# Patient Record
Sex: Female | Born: 2000 | Race: Black or African American | Hispanic: No | Marital: Single | Smoking: Never smoker
Health system: Southern US, Community
[De-identification: ages and names within clinical notes are randomized; demographics above are authoritative.]

## PROBLEM LIST (undated history)

## (undated) ENCOUNTER — Ambulatory Visit (HOSPITAL_COMMUNITY): Admission: EM | Payer: Medicaid Other

## (undated) DIAGNOSIS — R87629 Unspecified abnormal cytological findings in specimens from vagina: Secondary | ICD-10-CM

## (undated) DIAGNOSIS — T8859XA Other complications of anesthesia, initial encounter: Secondary | ICD-10-CM

## (undated) DIAGNOSIS — O24419 Gestational diabetes mellitus in pregnancy, unspecified control: Secondary | ICD-10-CM

## (undated) DIAGNOSIS — Z789 Other specified health status: Secondary | ICD-10-CM

## (undated) HISTORY — PX: MOUTH SURGERY: SHX715

## (undated) HISTORY — DX: Unspecified abnormal cytological findings in specimens from vagina: R87.629

## (undated) HISTORY — DX: Gestational diabetes mellitus in pregnancy, unspecified control: O24.419

## (undated) HISTORY — PX: NO PAST SURGERIES: SHX2092

## (undated) HISTORY — DX: Other specified health status: Z78.9

---
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
## 2018-05-09 ENCOUNTER — Other Ambulatory Visit: Payer: Self-pay

## 2018-05-09 ENCOUNTER — Ambulatory Visit
Admission: EM | Admit: 2018-05-09 | Discharge: 2018-05-09 | Disposition: A | Discharge status: Home / Self Care | Payer: Medicaid Other | Attending: Family Medicine | Admitting: Family Medicine

## 2018-05-09 DIAGNOSIS — K219 Gastro-esophageal reflux disease without esophagitis: Secondary | ICD-10-CM | POA: Diagnosis not present

## 2018-05-09 MED ORDER — LIDOCAINE VISCOUS HCL 2 % MT SOLN
15.0000 mL | Freq: Once | OROMUCOSAL | Status: AC
  Administered 2018-05-09: 17:00:00 15 mL via ORAL

## 2018-05-09 MED ORDER — OMEPRAZOLE 20 MG PO CPDR: 20.0000 mg | DELAYED_RELEASE_CAPSULE | Freq: Every day | ORAL | 0 refills | Status: DC

## 2018-05-09 MED ORDER — POLYETHYLENE GLYCOL 3350 17 GM/SCOOP PO POWD: 1.0000 | Freq: Once | ORAL | 0 refills | Status: AC

## 2018-05-09 MED ORDER — ALUM & MAG HYDROXIDE-SIMETH 200-200-20 MG/5ML PO SUSP
30.0000 mL | Freq: Once | ORAL | Status: AC
  Administered 2018-05-09: 30 mL via ORAL

## 2018-05-09 NOTE — Discharge Instructions (Signed)
I believe your symptoms are associated with acid reflux.  We are going to try omeprazole 20 mg once daily  Make sure that you take the omeprazole 30- 60 minutes prior to a meal with a glass of water.  Avoid spicy, greasy foods, caffeine, chocolate and milk products.  No eating 2-3 hours before bedtime. Elevate the head of the bed 30 degrees.  Try this for a few weeks to see if this improves your symptoms.  If you don't see any improvement or your symptoms worsen please follow up with a GI   

## 2018-05-09 NOTE — ED Provider Notes (Signed)
[facility]    CSN: [id number] Arrival date & time: 05/09/18  1610     History   Chief Complaint Chief Complaint  Patient presents with  . Chest Pain    HPI Michelle Chen is a 18 y.o. female.   Pt is a 18 year old female that presents with chest pain that she describes as burning in her chest. Symptoms have been waxing and waning for about a week. Worse with eating. She has been eating fast food most of the past week.  Her grandmother gave her gas x on sunday that somewhat relieved the pain. She does have a hx of acid reflux and has used the [employer] in the past that helped. She is also having some mild constipation and has been straining to have BM. No N,V,D. No cough, congestion or SOB. No fever. No recent travels or sick contacts.   ROS per HPI      History reviewed. No pertinent past medical history.  Patient Active Problem List   Diagnosis Date Noted  . Encounter for management and injection of depo-Provera 11/19/2014    History reviewed. No pertinent surgical history.  OB History   No obstetric history on file.      Home Medications    Prior to Admission medications   Medication Sig Start Date End Date Taking? Authorizing Provider  omeprazole (PRILOSEC) 20 MG capsule Take 1 capsule (20 mg total) by mouth daily. 05/09/18   Dahlia Byes A, NP  polyethylene glycol powder (GLYCOLAX/MIRALAX) 17 GM/SCOOP powder Take 255 g by mouth once for 1 dose. 1 scoop by mouth daily until good bowel movement. 05/09/18 05/09/18  Janace Aris, NP    Family History Family History  Problem Relation Age of Onset  . Learning disabilities Sister   . Asthma Sister   . Asthma Brother   . Asthma Maternal Grandmother   . Alzheimer's disease Maternal Grandfather     Social History Social History   Tobacco Use  . Smoking status: Passive Smoke Exposure - Never Smoker  . Smokeless tobacco: Never Used  . Tobacco comment: gma smokes in and out of the home   Substance  Use Topics  . Alcohol use: No    Alcohol/week: 0.0 standard drinks  . Drug use: No     Allergies   Patient has no known allergies.   Review of Systems Review of Systems   Physical Exam Triage Vital Signs ED Triage Vitals  Enc Vitals Group     BP 05/09/18 1621 116/79     Pulse Rate 05/09/18 1621 (!) 54     Resp 05/09/18 1621 20     Temp 05/09/18 1621 98.2 F (36.8 C)     Temp Source 05/09/18 1621 Oral     SpO2 05/09/18 1621 98 %     Weight --      Height --      Head Circumference --      Peak Flow --      Pain Score 05/09/18 1622 4     Pain Loc --      Pain Edu? --      Excl. in GC? --    No data found.  Updated Vital Signs BP 116/79   Pulse (!) 54   Temp 98.2 F (36.8 C) (Oral)   Resp 20   LMP  (LMP Unknown)   SpO2 98%   Visual Acuity Right Eye Distance:   Left Eye Distance:   Bilateral Distance:  Right Eye Near:   Left Eye Near:    Bilateral Near:     Physical Exam Vitals signs and nursing note reviewed.  Constitutional:      Appearance: She is well-developed.  HENT:     Head: Normocephalic and atraumatic.  Neck:     Musculoskeletal: Normal range of motion.  Cardiovascular:     Rate and Rhythm: Normal rate and regular rhythm.     Heart sounds: Normal heart sounds.  Pulmonary:     Effort: Pulmonary effort is normal.     Breath sounds: Normal breath sounds.  Chest:     Chest wall: No tenderness.  Musculoskeletal: Normal range of motion.  Skin:    General: Skin is warm and dry.  Neurological:     Mental Status: She is alert.  Psychiatric:        Mood and Affect: Mood normal.      UC Treatments / Results  Labs (all labs ordered are listed, but only abnormal results are displayed) Labs Reviewed - No data to display  EKG None  Radiology No results found.  Procedures Procedures (including critical care time)  Medications Ordered in UC Medications  alum & mag hydroxide-simeth (MAALOX/MYLANTA) 200-200-20 MG/5ML suspension 30  mL (30 mLs Oral Given 05/09/18 1651)    And  lidocaine (XYLOCAINE) 2 % viscous mouth solution 15 mL (15 mLs Oral Given 05/09/18 1651)    Initial Impression / Assessment and Plan / UC Course  I have reviewed the triage vital signs and the nursing notes.  Pertinent labs & imaging results that were available during my care of the patient were reviewed by me and considered in my medical decision making (see chart for details).     Exam normal VSS, non toxic or ill appearing.  No concern for ACS Symptoms consistent with GERD GI cocktail given here in the clinic Some relief from the GI cocktail Will have her start on omeprazole daily with instruction Instructed to avoid spicy, greasy foods MiraLAX prescribed for constipation Follow up as needed for continued or worsening symptoms  Final Clinical Impressions(s) / UC Diagnoses   Final diagnoses:  Gastroesophageal reflux disease without esophagitis     Discharge Instructions     I believe your symptoms are associated with acid reflux.  We are going to try omeprazole 20 mg once daily  Make sure that you take the omeprazole 30- 60 minutes prior to a meal with a glass of water.  Avoid spicy, greasy foods, caffeine, chocolate and milk products.  No eating 2-3 hours before bedtime. Elevate the head of the bed 30 degrees.  Try this for a few weeks to see if this improves your symptoms.  If you don't see any improvement or your symptoms worsen please follow up with a GI      ED Prescriptions    Medication Sig Dispense Auth. Provider   omeprazole (PRILOSEC) 20 MG capsule Take 1 capsule (20 mg total) by mouth daily. 30 capsule Kerman Pfost A, NP   polyethylene glycol powder (GLYCOLAX/MIRALAX) 17 GM/SCOOP powder Take 255 g by mouth once for 1 dose. 1 scoop by mouth daily until good bowel movement. 255 g Dahlia ByesBast, Tynan Boesel A, NP     Controlled Substance Prescriptions [place] Controlled Substance Registry consulted? Not Applicable   Janace ArisBast, Delores Thelen A, NP  05/09/18 [phone]

## 2018-11-02 ENCOUNTER — Ambulatory Visit (INDEPENDENT_AMBULATORY_CARE_PROVIDER_SITE_OTHER): Payer: Medicaid Other | Admitting: Pediatrics

## 2018-11-02 ENCOUNTER — Other Ambulatory Visit: Payer: Self-pay

## 2018-11-02 DIAGNOSIS — N760 Acute vaginitis: Secondary | ICD-10-CM | POA: Diagnosis not present

## 2018-11-02 MED ORDER — FLUCONAZOLE 150 MG PO TABS: ORAL_TABLET | ORAL | 0 refills | Status: DC

## 2018-11-02 NOTE — Progress Notes (Signed)
Virtual Visit via Video Note  I connected with Michelle Chen on 11/02/18 at  3:30 PM EDT by a video enabled telemedicine application and verified that I am speaking with the correct person using two identifiers.   Location of patient/parent: stopped in her car   I discussed the limitations of evaluation and management by telemedicine and the availability of in person appointments.  I discussed that the purpose of this telehealth visit is to provide medical care while limiting exposure to the novel coronavirus.  The patient expressed understanding and agreed to proceed.  Reason for visit:   Itching and burning of vaginal area off and on for the past 1 week.   History of Present Illness:   This 18 year old is concerned about vaginal itching with occasional burning off and on for the past week. She has redness and tenderness of the labia and a white discharge. She denies any other vaginal discharge. No abdominal pain. No pelvic pain. No back pain. Last period 4 weeks ago. She has a nexplanon in place since 07/2017. She denies sexual activity and reports she has never been sexually active.    Observations/Objective:   Alert and in no distress. Vaginal exam deferred since virtual visit. No abdominal, suprapubic or CVA tenderness.   Assessment and Plan:   1. Acute vaginitis Most consistent with yeast vaginitis Will treat empirically and patient to return call for increased severity of symptoms or not resolved after treatment  - fluconazole (DIFLUCAN) 150 MG tablet; Take 1 tablet on day 1 and repeat in 72 hours  Dispense: 2 tablet; Refill: 0   Follow Up Instructions:   Patient to f/u birth control surveillance with adolescent clinic and transfer care to adult medicine for routine care. May follow up here as needed until transfer to adult care.    I discussed the assessment and treatment plan with the patient and/or parent/guardian. They were provided an opportunity to ask questions and all  were answered. They agreed with the plan and demonstrated an understanding of the instructions.   They were advised to call back or seek an in-person evaluation in the emergency room if the symptoms worsen or if the condition fails to improve as anticipated.  I spent 16 minutes on this telehealth visit inclusive of face-to-face video and care coordination time I was located at [facility] during this encounter.  Rae Lips, MD

## 2019-05-25 ENCOUNTER — Ambulatory Visit (INDEPENDENT_AMBULATORY_CARE_PROVIDER_SITE_OTHER): Payer: Medicaid Other | Admitting: Family

## 2019-05-25 ENCOUNTER — Encounter: Payer: Self-pay | Admitting: Family

## 2019-05-25 ENCOUNTER — Other Ambulatory Visit: Payer: Self-pay

## 2019-05-25 ENCOUNTER — Other Ambulatory Visit (HOSPITAL_COMMUNITY)
Admission: RE | Admit: 2019-05-25 | Discharge: 2019-05-25 | Disposition: A | Discharge status: Home / Self Care | Payer: Medicaid Other | Source: Ambulatory Visit | Attending: Family | Admitting: Family

## 2019-05-25 VITALS — BP 117/73 | HR 75 | Ht 63.0 in | Wt 167.6 lb

## 2019-05-25 DIAGNOSIS — Z975 Presence of (intrauterine) contraceptive device: Secondary | ICD-10-CM | POA: Diagnosis not present

## 2019-05-25 DIAGNOSIS — Z113 Encounter for screening for infections with a predominantly sexual mode of transmission: Secondary | ICD-10-CM | POA: Insufficient documentation

## 2019-05-25 DIAGNOSIS — N921 Excessive and frequent menstruation with irregular cycle: Secondary | ICD-10-CM | POA: Diagnosis not present

## 2019-05-25 NOTE — Progress Notes (Signed)
History was provided by the patient.  Michelle Chen is a 20 y.o. female who is here for breakthrough bleeding with nexplanon.   PCP confirmed? Yes.    Michelle Erie, MD  HPI:   -sudden onset of breakthrough bleeding with Nexplanon  -no cramping or pain with intercourse -no nipple changes or discharge -no vaginal lesions or discharge -   Review of Systems  Constitutional: Negative for chills, fever and malaise/fatigue.  Eyes: Negative for blurred vision, double vision and pain.  Cardiovascular: Negative for chest pain and palpitations.  Gastrointestinal: Negative for abdominal pain, diarrhea and nausea.  Genitourinary: Negative for dysuria.  Skin: Negative for rash.  Neurological: Negative for dizziness and headaches.  Psychiatric/Behavioral: Negative for depression. The patient is not nervous/anxious.      Patient Active Problem List   Diagnosis Date Noted  . Encounter for management and injection of depo-Provera 11/19/2014    Current Outpatient Medications on File Prior to Visit  Medication Sig Dispense Refill  . fluconazole (DIFLUCAN) 150 MG tablet Take 1 tablet on day 1 and repeat in 72 hours 2 tablet 0  . omeprazole (PRILOSEC) 20 MG capsule Take 1 capsule (20 mg total) by mouth daily. (Patient not taking: Reported on 05/25/2019) 30 capsule 0   Current Facility-Administered Medications on File Prior to Visit  Medication Dose Route Frequency Provider Last Rate Last Admin  . etonogestrel (NEXPLANON) implant 68 mg  68 mg Subdermal Once Georges Mouse, NP        No Known Allergies  Physical Exam:    Vitals:   05/25/19 0832  BP: 117/73  Pulse: 75  Weight: 167 lb 9.6 oz (76 kg)  Height: 5\' 3"  (1.6 m)   Wt Readings from Last 3 Encounters:  05/25/19 167 lb 9.6 oz (76 kg)  08/20/17 173 lb 12.8 oz (78.8 kg) (94 %, Z= 1.53)*  07/18/17 150 lb (68 kg) (83 %, Z= 0.95)*   * Growth percentiles are based on CDC (Girls, 2-20 Years) data.    Growth percentile  SmartLinks can only be used for patients less than 5 years old. No LMP recorded. Patient has had an injection.  Physical Exam Constitutional:      Appearance: She is not ill-appearing.  HENT:     Head: Normocephalic.  Eyes:     Extraocular Movements: Extraocular movements intact.     Pupils: Pupils are equal, round, and reactive to light.  Cardiovascular:     Rate and Rhythm: Normal rate and regular rhythm.     Heart sounds: No murmur.  Pulmonary:     Effort: Pulmonary effort is normal.  Musculoskeletal:        General: No swelling. Normal range of motion.     Cervical back: Normal range of motion and neck supple. No tenderness.  Lymphadenopathy:     Cervical: No cervical adenopathy.  Skin:    General: Skin is warm and dry.     Findings: No rash.  Neurological:     General: No focal deficit present.     Mental Status: She is alert and oriented to person, place, and time.  Psychiatric:        Mood and Affect: Mood normal.      Assessment/Plan:  19 yo A/I female with nexplanon presenting with c/o of sudden onset of bleeding after a period of amenorrhea with method. Discussed that we will assess for infection or other process for reason for bleeding and determine next steps. She would like to keep  the method if bleeding can subside.   1. Breakthrough bleeding on Nexplanon  - TSH - T4, free - Prolactin - VITAMIN D 25 Hydroxy (Vit-D Deficiency, Fractures) - Hemoglobin A1c - WET PREP BY MOLECULAR PROBE - CBC  2. Routine screening for STI (sexually transmitted infection)  - Urine cytology ancillary only

## 2019-05-25 NOTE — Patient Instructions (Addendum)
Today we discussed reasons why you may be having breakthrough bleeding.  I will call you with results from today's tests and discuss options.

## 2019-05-26 LAB — CBC
HCT: 45.6 % — ABNORMAL HIGH (ref 35.0–45.0)
Hemoglobin: 15 g/dL (ref 11.7–15.5)
MCH: 32.1 pg (ref 27.0–33.0)
MCHC: 32.9 g/dL (ref 32.0–36.0)
MCV: 97.4 fL (ref 80.0–100.0)
MPV: 10.5 fL (ref 7.5–12.5)
Platelets: 314 10*3/uL (ref 140–400)
RBC: 4.68 10*6/uL (ref 3.80–5.10)
RDW: 11.9 % (ref 11.0–15.0)
WBC: 7 10*3/uL (ref 3.8–10.8)

## 2019-05-26 LAB — WET PREP BY MOLECULAR PROBE
Candida species: NOT DETECTED
MICRO NUMBER:: 10420896
SPECIMEN QUALITY:: ADEQUATE
Trichomonas vaginosis: NOT DETECTED

## 2019-05-26 LAB — TSH: TSH: 1.33 mIU/L

## 2019-05-26 LAB — VITAMIN D 25 HYDROXY (VIT D DEFICIENCY, FRACTURES): Vit D, 25-Hydroxy: 9 ng/mL — ABNORMAL LOW (ref 30–100)

## 2019-05-26 LAB — T4, FREE: Free T4: 1.1 ng/dL (ref 0.8–1.4)

## 2019-05-26 LAB — HEMOGLOBIN A1C
Hgb A1c MFr Bld: 5.1 % of total Hgb (ref <5.7)
Mean Plasma Glucose: 100 (calc)
eAG (mmol/L): 5.5 (calc)

## 2019-05-26 LAB — PROLACTIN: Prolactin: 17 ng/mL

## 2019-05-29 LAB — URINE CYTOLOGY ANCILLARY ONLY
Bacterial Vaginitis-Urine: POSITIVE — AB
Candida Urine: NEGATIVE
Chlamydia: NEGATIVE
Comment: NEGATIVE
Comment: NEGATIVE
Comment: NORMAL
Neisseria Gonorrhea: NEGATIVE
Trichomonas: NEGATIVE

## 2019-05-30 ENCOUNTER — Other Ambulatory Visit: Payer: Self-pay | Admitting: Family

## 2019-05-30 MED ORDER — METRONIDAZOLE 500 MG PO TABS
500.0000 mg | ORAL_TABLET | Freq: Two times a day (BID) | ORAL | 0 refills | Status: DC
Start: 2019-05-30 — End: 2019-09-27

## 2019-05-30 MED ORDER — VITAMIN D (ERGOCALCIFEROL) 1.25 MG (50000 UNIT) PO CAPS
50000.0000 [IU] | ORAL_CAPSULE | ORAL | 0 refills | Status: DC
Start: 2019-05-30 — End: 2019-07-24

## 2019-05-30 NOTE — Progress Notes (Unsigned)
fla 

## 2019-06-07 ENCOUNTER — Encounter (HOSPITAL_COMMUNITY): Payer: Self-pay | Admitting: Emergency Medicine

## 2019-06-07 ENCOUNTER — Other Ambulatory Visit: Payer: Self-pay

## 2019-06-07 ENCOUNTER — Ambulatory Visit (HOSPITAL_COMMUNITY)
Admission: EM | Admit: 2019-06-07 | Discharge: 2019-06-07 | Disposition: A | Discharge status: Home / Self Care | Payer: Medicaid Other | Attending: Internal Medicine | Admitting: Internal Medicine

## 2019-06-07 DIAGNOSIS — J020 Streptococcal pharyngitis: Secondary | ICD-10-CM

## 2019-06-07 LAB — POCT RAPID STREP A: Streptococcus, Group A Screen (Direct): POSITIVE — AB

## 2019-06-07 MED ORDER — ACETAMINOPHEN 325 MG PO TABS
ORAL_TABLET | ORAL | Status: AC
  Filled 2019-06-07: qty 2

## 2019-06-07 MED ORDER — ACETAMINOPHEN 325 MG PO TABS
650.0000 mg | ORAL_TABLET | Freq: Once | ORAL | Status: AC
  Administered 2019-06-07: 650 mg via ORAL

## 2019-06-07 MED ORDER — AMOXICILLIN 500 MG PO CAPS
1000.0000 mg | ORAL_CAPSULE | Freq: Every day | ORAL | 0 refills | Status: AC
Start: 2019-06-07 — End: 2019-06-17

## 2019-06-07 NOTE — ED Triage Notes (Signed)
Sore throat for 2 days. Denies headache, fever, body aches, cough, congestion.

## 2019-06-07 NOTE — ED Provider Notes (Signed)
[place]    CSN: [id number] Arrival date & time: 06/07/19  1696      History   Chief Complaint Chief Complaint  Patient presents with  . Sore Throat    HPI Michelle Chen is a 19 y.o. female.   Patient is a 19 year old female presents today with sore throat x2 days.  Symptoms have been constant.  She has had pain with swallowing.  Fever here today of 100.8. has not taken anything for the symptoms.  Denies any associated headache, cough, congestion, rhinorrhea, loss of taste or smell.  ROS per HPI      History reviewed. No pertinent past medical history.  Patient Active Problem List   Diagnosis Date Noted  . Encounter for management and injection of depo-Provera 11/19/2014    History reviewed. No pertinent surgical history.  OB History   No obstetric history on file.      Home Medications    Prior to Admission medications   Medication Sig Start Date End Date Taking? Authorizing Provider  Vitamin D, Ergocalciferol, (DRISDOL) 1.25 MG (50000 UNIT) CAPS capsule Take 1 capsule (50,000 Units total) by mouth every 7 (seven) days. 05/30/19  Yes Parthenia Ames, NP  amoxicillin (AMOXIL) 500 MG capsule Take 2 capsules (1,000 mg total) by mouth daily for 10 days. 06/07/19 06/17/19  Loura Halt A, NP  fluconazole (DIFLUCAN) 150 MG tablet Take 1 tablet on day 1 and repeat in 72 hours 11/02/18   Rae Lips, MD  metroNIDAZOLE (FLAGYL) 500 MG tablet Take 1 tablet (500 mg total) by mouth 2 (two) times daily. 05/30/19   Parthenia Ames, NP  omeprazole (PRILOSEC) 20 MG capsule Take 1 capsule (20 mg total) by mouth daily. 05/09/18   Orvan July, NP    Family History Family History  Problem Relation Age of Onset  . Learning disabilities Sister   . Asthma Sister   . Asthma Brother   . Asthma Maternal Grandmother   . Alzheimer's disease Maternal Grandfather     Social History Social History   Tobacco Use  . Smoking status: Passive Smoke Exposure - Never  Smoker  . Smokeless tobacco: Never Used  . Tobacco comment: gma smokes in and out of the home   Substance Use Topics  . Alcohol use: No    Alcohol/week: 0.0 standard drinks  . Drug use: No     Allergies   Patient has no known allergies.   Review of Systems Review of Systems   Physical Exam Triage Vital Signs ED Triage Vitals  Enc Vitals Group     BP 06/07/19 0925 119/80     Pulse Rate 06/07/19 0923 (!) 108     Resp 06/07/19 0923 16     Temp 06/07/19 0923 (!) 100.8 F (38.2 C)     Temp Source 06/07/19 0923 Oral     SpO2 06/07/19 0923 100 %     Weight --      Height --      Head Circumference --      Peak Flow --      Pain Score 06/07/19 0922 7     Pain Loc --      Pain Edu? --      Excl. in [place]? --    No data found.  Updated Vital Signs BP 119/80   Pulse (!) 108   Temp (!) 100.8 F (38.2 C) (Oral)   Resp 16   LMP 05/17/2019   SpO2 100%  Visual Acuity Right Eye Distance:   Left Eye Distance:   Bilateral Distance:    Right Eye Near:   Left Eye Near:    Bilateral Near:     Physical Exam Vitals and nursing note reviewed.  Constitutional:      General: She is not in acute distress.    Appearance: Normal appearance. She is not ill-appearing, toxic-appearing or diaphoretic.  HENT:     Head: Normocephalic.     Nose: Nose normal.     Mouth/Throat:     Pharynx: Oropharynx is clear. Posterior oropharyngeal erythema present.     Tonsils: Tonsillar exudate present. 2+ on the right. 2+ on the left.  Eyes:     Conjunctiva/sclera: Conjunctivae normal.  Pulmonary:     Effort: Pulmonary effort is normal.  Musculoskeletal:        General: Normal range of motion.     Cervical back: Normal range of motion.  Skin:    General: Skin is warm and dry.     Findings: No rash.  Neurological:     Mental Status: She is alert.  Psychiatric:        Mood and Affect: Mood normal.      UC Treatments / Results  Labs (all labs ordered are listed, but only abnormal  results are displayed) Labs Reviewed  POCT RAPID STREP A - Abnormal; Notable for the following components:      Result Value   Streptococcus, Group A Screen (Direct) POSITIVE (*)    All other components within normal limits    EKG   Radiology No results found.  Procedures Procedures (including critical care time)  Medications Ordered in UC Medications  acetaminophen (TYLENOL) tablet 650 mg (650 mg Oral Given 06/07/19 0630)    Initial Impression / Assessment and Plan / UC Course  I have reviewed the triage vital signs and the nursing notes.  Pertinent labs & imaging results that were available during my care of the patient were reviewed by me and considered in my medical decision making (see chart for details).     Strep pharyngitis Rapid strep test positive.  Treat with amoxicillin.  Recommended Tylenol or ibuprofen as needed for pain, fever.  Warm salt water gargles. Follow up as needed for continued or worsening symptoms  Final Clinical Impressions(s) / UC Diagnoses   Final diagnoses:  Strep pharyngitis     Discharge Instructions     Treating you for strep throat. Take the amoxicillin daily for the next 10 days Tylenol or ibuprofen for pain, fever Warm salt water gargles and throat can help Follow up as needed for continued or worsening symptoms     ED Prescriptions    Medication Sig Dispense Auth. Provider   amoxicillin (AMOXIL) 500 MG capsule Take 2 capsules (1,000 mg total) by mouth daily for 10 days. 20 capsule Dahlia Byes A, NP     PDMP not reviewed this encounter.   Janace Aris, NP 06/07/19 [phone]

## 2019-06-07 NOTE — Discharge Instructions (Signed)
Treating you for strep throat. Take the amoxicillin daily for the next 10 days Tylenol or ibuprofen for pain, fever Warm salt water gargles and throat can help Follow up as needed for continued or worsening symptoms

## 2019-07-22 ENCOUNTER — Other Ambulatory Visit: Payer: Self-pay | Admitting: Family

## 2019-07-27 ENCOUNTER — Ambulatory Visit (HOSPITAL_COMMUNITY)
Admission: EM | Admit: 2019-07-27 | Discharge: 2019-07-27 | Disposition: A | Discharge status: Home / Self Care | Payer: Medicaid Other | Attending: Family Medicine | Admitting: Family Medicine

## 2019-07-27 ENCOUNTER — Other Ambulatory Visit: Payer: Self-pay

## 2019-07-27 ENCOUNTER — Encounter (HOSPITAL_COMMUNITY): Payer: Self-pay

## 2019-07-27 DIAGNOSIS — N898 Other specified noninflammatory disorders of vagina: Secondary | ICD-10-CM

## 2019-07-27 DIAGNOSIS — Z419 Encounter for procedure for purposes other than remedying health state, unspecified: Secondary | ICD-10-CM | POA: Diagnosis not present

## 2019-07-27 MED ORDER — FLUCONAZOLE 200 MG PO TABS: 200.0000 mg | ORAL_TABLET | Freq: Once | ORAL | 0 refills | Status: AC

## 2019-07-27 NOTE — ED Provider Notes (Signed)
[facility]   [id number] 07/27/19 Arrival Time: 1452   CC: VAGINAL DISCHARGE  SUBJECTIVE:  Michelle Chen is a 19 y.o. female who presents with complaints of vaginal discharge and itching for the last week.  Reports the discharge is white in color.  Denies using antibiotics.Has not taken OTC medications for this.  Reports that she would like to start birth control.  She denies fever, chills, nausea, vomiting, abdominal or pelvic pain, urinary symptoms, vaginal itching, vaginal odor, vaginal bleeding, dyspareunia, vaginal rashes or lesions.   Patient's last menstrual period was 07/05/2019. Current birth control method: none  ROS: As per HPI.  All other pertinent ROS negative.     History reviewed. No pertinent past medical history. History reviewed. No pertinent surgical history. No Known Allergies Current Facility-Administered Medications on File Prior to Encounter  Medication Dose Route Frequency Provider Last Rate Last Admin   etonogestrel (NEXPLANON) implant 68 mg  68 mg Subdermal Once Georges Mouse, NP       Current Outpatient Medications on File Prior to Encounter  Medication Sig Dispense Refill   metroNIDAZOLE (FLAGYL) 500 MG tablet Take 1 tablet (500 mg total) by mouth 2 (two) times daily. 14 tablet 0   omeprazole (PRILOSEC) 20 MG capsule Take 1 capsule (20 mg total) by mouth daily. 30 capsule 0   Vitamin D, Ergocalciferol, (DRISDOL) 1.25 MG (50000 UNIT) CAPS capsule TAKE 1 CAPSULE BY MOUTH EVERY 7 DAYS 8 capsule 0    Social History   Socioeconomic History   Marital status: Single    Spouse name: Not on file   Number of children: Not on file   Years of education: Not on file   Highest education level: Not on file  Occupational History   Not on file  Tobacco Use   Smoking status: Passive Smoke Exposure - Never Smoker   Smokeless tobacco: Never Used   Tobacco comment: gma smokes in and out of the home   Substance and Sexual Activity    Alcohol use: No    Alcohol/week: 0.0 standard drinks   Drug use: No   Sexual activity: Never  Other Topics Concern   Not on file  Social History Narrative   Michelle Chen lives with her maternal grandparents and siblings. Mother is local and has contact with the family.       Social Determinants of Health   Financial Resource Strain:    Difficulty of Paying Living Expenses:   Food Insecurity:    Worried About Programme researcher, broadcasting/film/video in the Last Year:    Barista in the Last Year:   Transportation Needs:    Freight forwarder (Medical):    Lack of Transportation (Non-Medical):   Physical Activity:    Days of Exercise per Week:    Minutes of Exercise per Session:   Stress:    Feeling of Stress :   Social Connections:    Frequency of Communication with Friends and Family:    Frequency of Social Gatherings with Friends and Family:    Attends Religious Services:    Active Member of Clubs or Organizations:    Attends Engineer, structural:    Marital Status:   Intimate Partner Violence:    Fear of Current or Ex-Partner:    Emotionally Abused:    Physically Abused:    Sexually Abused:    Family History  Problem Relation Age of Onset   Learning disabilities Sister    Asthma Sister  Asthma Brother    Asthma Maternal Grandmother    Alzheimer's disease Maternal Grandfather     OBJECTIVE:  Vitals:   07/27/19 1505  BP: 112/75  Pulse: 95  Resp: 16  Temp: 98.4 F (36.9 C)  TempSrc: Oral  SpO2: 99%     General appearance: Alert, NAD, appears stated age Head: NCAT Throat: lips, mucosa, and tongue normal; teeth and gums normal Lungs: CTA bilaterally without adventitious breath sounds Heart: regular rate and rhythm.  Radial pulses 2+ symmetrical bilaterally Back: no CVA tenderness Abdomen: soft, non-tender; bowel sounds normal; no masses or organomegaly; no guarding or rebound tenderness GU: External examination without vulvar  lesions or erythema Skin: warm and dry Psychological:  Alert and cooperative. Normal mood and affect.  LABS:  Results for orders placed or performed during the hospital encounter of 06/07/19  POCT rapid strep A [facility])  Result Value Ref Range   Streptococcus, Group A Screen (Direct) POSITIVE (A) NEGATIVE    Labs Reviewed - No data to display  ASSESSMENT & PLAN:  1. Vaginal itching   2. Vaginal discharge     Meds ordered this encounter  Medications   fluconazole (DIFLUCAN) 200 MG tablet    Sig: Take 1 tablet (200 mg total) by mouth once for 1 dose.    Dispense:  2 tablet    Refill:  0    Order Specific Question:   Supervising Provider    Answer:   Merrilee Jansky [id number]     Vaginal swab obtained.   We will follow up with you regarding abnormal results Declines HIV/ syphilis testing today Prescribed diflucan 150 mg once daily and then second dose 72 hours later Take medications as prescribed and to completion If tests results are positive, please abstain from sexual activity until you and your partner(s) have been treated Follow up with PCP or [facility] if symptoms persists Return here or go to ER if you have any new or worsening symptoms fever, chills, nausea, vomiting, abdominal or pelvic pain, painful intercourse, vaginal discharge, vaginal bleeding, persistent symptoms despite treatment Reviewed expectations re: course of current medical issues. Questions answered. Outlined signs and symptoms indicating need for more acute intervention. Patient verbalized understanding. After Visit Summary given.        Moshe Cipro, NP 07/27/19 1534

## 2019-07-27 NOTE — Discharge Instructions (Signed)
   Your swab tests are pending.  If your test results are positive, we will call you.  You may need additional treatment and your partner(s) may also need treatment.      

## 2019-07-27 NOTE — ED Triage Notes (Signed)
Pt c/o vaginal itching/discomfort with white discharge since 06/25. Has not taken OTC meds for symptoms.  Denies abdom pain, n/v/d, fever, chills, dysuria.  Also reports LMP was 14 days in duration

## 2019-07-28 LAB — CERVICOVAGINAL ANCILLARY ONLY
Bacterial Vaginitis (gardnerella): NEGATIVE
Candida Glabrata: NEGATIVE
Candida Vaginitis: POSITIVE — AB
Chlamydia: NEGATIVE
Comment: NEGATIVE
Comment: NEGATIVE
Comment: NEGATIVE
Comment: NEGATIVE
Comment: NEGATIVE
Comment: NORMAL
Neisseria Gonorrhea: NEGATIVE
Trichomonas: NEGATIVE

## 2019-08-27 DIAGNOSIS — Z419 Encounter for procedure for purposes other than remedying health state, unspecified: Secondary | ICD-10-CM | POA: Diagnosis not present

## 2019-09-18 ENCOUNTER — Other Ambulatory Visit: Payer: Self-pay | Admitting: Pediatrics

## 2019-09-18 DIAGNOSIS — N898 Other specified noninflammatory disorders of vagina: Secondary | ICD-10-CM | POA: Diagnosis not present

## 2019-09-27 ENCOUNTER — Other Ambulatory Visit: Payer: Self-pay

## 2019-09-27 ENCOUNTER — Ambulatory Visit (HOSPITAL_COMMUNITY)
Admission: EM | Admit: 2019-09-27 | Discharge: 2019-09-27 | Disposition: A | Discharge status: Home / Self Care | Payer: Medicaid Other | Attending: Family Medicine | Admitting: Family Medicine

## 2019-09-27 ENCOUNTER — Encounter (HOSPITAL_COMMUNITY): Payer: Self-pay

## 2019-09-27 DIAGNOSIS — N760 Acute vaginitis: Secondary | ICD-10-CM | POA: Diagnosis not present

## 2019-09-27 DIAGNOSIS — Z419 Encounter for procedure for purposes other than remedying health state, unspecified: Secondary | ICD-10-CM | POA: Diagnosis not present

## 2019-09-27 MED ORDER — FLUCONAZOLE 150 MG PO TABS
ORAL_TABLET | ORAL | 0 refills | Status: DC
Start: 2019-09-27 — End: 2019-10-30

## 2019-09-27 NOTE — ED Triage Notes (Signed)
Pt presents with vaginal discharge and itching X 2 weeks.

## 2019-09-27 NOTE — Discharge Instructions (Signed)
We have sent testing for sexually transmitted infections. We will notify you of any positive results once they are received. If required, we will prescribe any medications you might need.  Please refrain from all sexual activity for at least the next seven days.  

## 2019-09-28 LAB — CERVICOVAGINAL ANCILLARY ONLY
Bacterial Vaginitis (gardnerella): NEGATIVE
Candida Glabrata: NEGATIVE
Candida Vaginitis: POSITIVE — AB
Chlamydia: NEGATIVE
Comment: NEGATIVE
Comment: NEGATIVE
Comment: NEGATIVE
Comment: NEGATIVE
Comment: NEGATIVE
Comment: NORMAL
Neisseria Gonorrhea: NEGATIVE
Trichomonas: NEGATIVE

## 2019-09-28 NOTE — ED Provider Notes (Signed)
[facility]   [id number] 09/27/19 Arrival Time: 1923  ASSESSMENT & PLAN:  1. Acute vaginitis     Begin: Meds ordered this encounter  Medications  . fluconazole (DIFLUCAN) 150 MG tablet    Sig: Take one tablet by mouth as a single dose. May repeat in 3 days if symptoms persist.    Dispense:  2 tablet    Refill:  0      Discharge Instructions     We have sent testing for sexually transmitted infections. We will notify you of any positive results once they are received. If required, we will prescribe any medications you might need.  Please refrain from all sexual activity for at least the next seven days.     Without s/s of PID. No empiric tx desired for gonorrhea/chlamydia.   Labs Reviewed  CERVICOVAGINAL ANCILLARY ONLY   Will notify of any positive results. Instructed to refrain from sexual activity for at least seven days.  Reviewed expectations re: course of current medical issues. Questions answered. Outlined signs and symptoms indicating need for more acute intervention. Patient verbalized understanding. After Visit Summary given.   SUBJECTIVE:  Michelle Chen is a 19 y.o. female who presents with complaint of vaginal discharge and itching. Onset gradual. First noticed 1-2 w ago. Describes discharge as thick and white. No specific aggravating or alleviating factors reported. Denies: urinary frequency, dysuria and gross hematuria. Afebrile. No abdominal or pelvic pain. Normal PO intake wihout n/v. No genital rashes or lesions.   No LMP recorded. Patient has had an implant.   OBJECTIVE:  Vitals:   09/27/19 1958  BP: 125/70  Pulse: (!) 109  Resp: 19  Temp: 98 F (36.7 C)  TempSrc: Oral  SpO2: 93%     General appearance: alert, cooperative, appears stated age and no distress Lungs: unlabored respirations; speaks full sentences without difficulty Back: FROM at waist GU: deferred Skin: warm and dry Psychological: alert and cooperative;  normal mood and affect.    Labs Reviewed  CERVICOVAGINAL ANCILLARY ONLY    No Known Allergies  History reviewed. No pertinent past medical history. Family History  Problem Relation Age of Onset  . Learning disabilities Sister   . Asthma Sister   . Asthma Brother   . Asthma Maternal Grandmother   . Alzheimer's disease Maternal Grandfather    Social History   Socioeconomic History  . Marital status: Single    Spouse name: Not on file  . Number of children: Not on file  . Years of education: Not on file  . Highest education level: Not on file  Occupational History  . Not on file  Tobacco Use  . Smoking status: Passive Smoke Exposure - Never Smoker  . Smokeless tobacco: Never Used  . Tobacco comment: gma smokes in and out of the home   Substance and Sexual Activity  . Alcohol use: No    Alcohol/week: 0.0 standard drinks  . Drug use: No  . Sexual activity: Never  Other Topics Concern  . Not on file  Social History Narrative   [name] lives with her maternal grandparents and siblings. Mother is local and has contact with the family.       Social Determinants of Health   Financial Resource Strain:   . Difficulty of Paying Living Expenses: Not on file  Food Insecurity:   . Worried About Programme researcher, broadcasting/film/video in the Last Year: Not on file  . Ran Out of Food in the Last Year: Not on  file  Transportation Needs:   . Freight forwarder (Medical): Not on file  . Lack of Transportation (Non-Medical): Not on file  Physical Activity:   . Days of Exercise per Week: Not on file  . Minutes of Exercise per Session: Not on file  Stress:   . Feeling of Stress : Not on file  Social Connections:   . Frequency of Communication with Friends and Family: Not on file  . Frequency of Social Gatherings with Friends and Family: Not on file  . Attends Religious Services: Not on file  . Active Member of Clubs or Organizations: Not on file  . Attends Banker Meetings: Not  on file  . Marital Status: Not on file  Intimate Partner Violence:   . Fear of Current or Ex-Partner: Not on file  . Emotionally Abused: Not on file  . Physically Abused: Not on file  . Sexually Abused: Not on file          Mardella Layman, MD 09/28/19 [phone]

## 2019-10-27 DIAGNOSIS — Z419 Encounter for procedure for purposes other than remedying health state, unspecified: Secondary | ICD-10-CM | POA: Diagnosis not present

## 2019-10-30 ENCOUNTER — Encounter (HOSPITAL_COMMUNITY): Payer: Self-pay

## 2019-10-30 ENCOUNTER — Ambulatory Visit (HOSPITAL_COMMUNITY)
Admission: EM | Admit: 2019-10-30 | Discharge: 2019-10-30 | Disposition: A | Discharge status: Home / Self Care | Payer: Medicaid Other | Attending: Family Medicine | Admitting: Family Medicine

## 2019-10-30 ENCOUNTER — Other Ambulatory Visit: Payer: Self-pay

## 2019-10-30 DIAGNOSIS — Z7722 Contact with and (suspected) exposure to environmental tobacco smoke (acute) (chronic): Secondary | ICD-10-CM | POA: Insufficient documentation

## 2019-10-30 DIAGNOSIS — Z793 Long term (current) use of hormonal contraceptives: Secondary | ICD-10-CM | POA: Insufficient documentation

## 2019-10-30 DIAGNOSIS — J069 Acute upper respiratory infection, unspecified: Secondary | ICD-10-CM | POA: Diagnosis not present

## 2019-10-30 DIAGNOSIS — U071 COVID-19: Secondary | ICD-10-CM | POA: Diagnosis not present

## 2019-10-30 DIAGNOSIS — Z79899 Other long term (current) drug therapy: Secondary | ICD-10-CM | POA: Insufficient documentation

## 2019-10-30 DIAGNOSIS — R42 Dizziness and giddiness: Secondary | ICD-10-CM

## 2019-10-30 MED ORDER — ACETAMINOPHEN 325 MG PO TABS
650.0000 mg | ORAL_TABLET | Freq: Once | ORAL | Status: AC
  Administered 2019-10-30: 650 mg via ORAL

## 2019-10-30 MED ORDER — BENZONATATE 100 MG PO CAPS: 100.0000 mg | ORAL_CAPSULE | Freq: Three times a day (TID) | ORAL | 0 refills | Status: DC | PRN

## 2019-10-30 MED ORDER — PROMETHAZINE-DM 6.25-15 MG/5ML PO SYRP
5.0000 mL | ORAL_SOLUTION | Freq: Every evening | ORAL | 0 refills | Status: DC | PRN
Start: 2019-10-30 — End: 2020-11-02

## 2019-10-30 MED ORDER — ACETAMINOPHEN 325 MG PO TABS
ORAL_TABLET | ORAL | Status: AC
  Filled 2019-10-30: qty 2

## 2019-10-30 NOTE — ED Triage Notes (Signed)
Pt present cough with dizziness, symptoms started yesterday. Pt states her cough is productive.

## 2019-10-30 NOTE — ED Provider Notes (Signed)
[facility]   MRN: [id number] DOB: 03-08-2000  Subjective:   Michelle Chen is a 19 y.o. female presenting for acute onset of cough, dizziness and general malaise.  Patient is not Covid vaccinated.  Has not had any direct known contacts that had COVID-19.   Current Facility-Administered Medications:  .  etonogestrel (NEXPLANON) implant 68 mg, 68 mg, Subdermal, Once, Georges Mouse, NP  Current Outpatient Medications:  .  fluconazole (DIFLUCAN) 150 MG tablet, Take one tablet by mouth as a single dose. May repeat in 3 days if symptoms persist., Disp: 2 tablet, Rfl: 0 .  omeprazole (PRILOSEC) 20 MG capsule, Take 1 capsule (20 mg total) by mouth daily., Disp: 30 capsule, Rfl: 0 .  Vitamin D, Ergocalciferol, (DRISDOL) 1.25 MG (50000 UNIT) CAPS capsule, TAKE 1 CAPSULE BY MOUTH EVERY 7 DAYS, Disp: 8 capsule, Rfl: 0   No Known Allergies  History reviewed. No pertinent past medical history.   History reviewed. No pertinent surgical history.  Family History  Problem Relation Age of Onset  . Learning disabilities Sister   . Asthma Sister   . Asthma Brother   . Asthma Maternal Grandmother   . Alzheimer's disease Maternal Grandfather     Social History   Tobacco Use  . Smoking status: Passive Smoke Exposure - Never Smoker  . Smokeless tobacco: Never Used  . Tobacco comment: gma smokes in and out of the home   Substance Use Topics  . Alcohol use: No    Alcohol/week: 0.0 standard drinks  . Drug use: No    ROS   Objective:   Vitals: BP 124/87 (BP Location: Right Arm)   Pulse 98   Temp (!) 102.4 F (39.1 C) (Oral)   Resp 18   SpO2 100%   Temp 99.64F on recheck s/p APAP.   Physical Exam Constitutional:      General: She is not in acute distress.    Appearance: Normal appearance. She is well-developed. She is not ill-appearing, toxic-appearing or diaphoretic.  HENT:     Head: Normocephalic and atraumatic.     Nose: Nose normal.     Mouth/Throat:       Mouth: Mucous membranes are moist.  Eyes:     Extraocular Movements: Extraocular movements intact.     Pupils: Pupils are equal, round, and reactive to light.  Cardiovascular:     Rate and Rhythm: Normal rate and regular rhythm.     Pulses: Normal pulses.     Heart sounds: Normal heart sounds. No murmur heard.  No friction rub. No gallop.   Pulmonary:     Effort: Pulmonary effort is normal. No respiratory distress.     Breath sounds: Normal breath sounds. No stridor. No wheezing, rhonchi or rales.  Skin:    General: Skin is warm and dry.     Findings: No rash.  Neurological:     Mental Status: She is alert and oriented to person, place, and time.  Psychiatric:        Mood and Affect: Mood normal.        Behavior: Behavior normal.        Thought Content: Thought content normal.      Assessment and Plan :   PDMP not reviewed this encounter.  1. Viral URI with cough   2. Dizziness     Fever improved dramatically with Tylenol. Will manage for viral illness such as viral URI, viral syndrome, viral rhinitis, COVID-19. Counseled patient on nature of  COVID-19 including modes of transmission, diagnostic testing, management and supportive care.  Offered scripts for symptomatic relief. COVID 19 testing is pending. Counseled patient on potential for adverse effects with medications prescribed/recommended today, ER and return-to-clinic precautions discussed, patient verbalized understanding.     Wallis Bamberg, [place] 10/31/19 [phone]

## 2019-10-30 NOTE — Discharge Instructions (Addendum)

## 2019-10-31 LAB — SARS CORONAVIRUS 2 (TAT 6-24 HRS): SARS Coronavirus 2: POSITIVE — AB

## 2019-11-01 ENCOUNTER — Telehealth: Payer: Self-pay | Admitting: Family

## 2019-11-01 NOTE — Telephone Encounter (Signed)
Called to Discuss with patient about Covid symptoms and the use of the monoclonal antibody infusion for those with mild to moderate Covid symptoms and at a high risk of hospitalization.     Pt appears to qualify for this infusion due to co-morbid conditions and/or a member of an at-risk group in accordance with the FDA Emergency Use Authorization.   Ms. Ghazarian was seen at the [facility] urgent care center on 10/30/2019 presenting with acute onset cough, dizziness, and general malaise.  Tested positive on 10/4.  Qualifying risk factors include BMI greater than 25 and increased social vulnerability risk.  Reached Ms. Whatley to discuss risks/benefits of treatment with [facility] and she is not interested at this time. Hotline number provided if she changes her mind.  Marcos Eke, NP 11/01/2019 2:08 PM

## 2019-11-03 ENCOUNTER — Ambulatory Visit (HOSPITAL_COMMUNITY)
Admission: EM | Admit: 2019-11-03 | Discharge: 2019-11-03 | Disposition: A | Discharge status: Home / Self Care | Payer: Medicaid Other | Attending: Family Medicine | Admitting: Family Medicine

## 2019-11-03 ENCOUNTER — Encounter (HOSPITAL_COMMUNITY): Payer: Self-pay | Admitting: Emergency Medicine

## 2019-11-03 ENCOUNTER — Other Ambulatory Visit: Payer: Self-pay

## 2019-11-03 DIAGNOSIS — L03012 Cellulitis of left finger: Secondary | ICD-10-CM | POA: Diagnosis not present

## 2019-11-03 MED ORDER — CEPHALEXIN 500 MG PO CAPS
500.0000 mg | ORAL_CAPSULE | Freq: Four times a day (QID) | ORAL | 0 refills | Status: DC
Start: 2019-11-03 — End: 2020-11-02

## 2019-11-03 MED ORDER — IBUPROFEN 600 MG PO TABS
600.0000 mg | ORAL_TABLET | Freq: Three times a day (TID) | ORAL | 0 refills | Status: DC | PRN
Start: 2019-11-03 — End: 2020-11-02

## 2019-11-03 NOTE — Discharge Instructions (Signed)
Warm soaks Antibiotics as prescribed Ibuprofen for pain as needed.  Follow up as needed for continued or worsening symptoms

## 2019-11-03 NOTE — ED Triage Notes (Signed)
Pt present with swelling in left middle finger. Pt tested positive on 10/30/19 for COVID.

## 2019-11-06 NOTE — ED Provider Notes (Signed)
[facility]    CSN: [id number] Arrival date & time: 11/03/19  1225      History   Chief Complaint Chief Complaint  Patient presents with  . Finger Injury    COVID +    HPI Michelle Chen is a 19 y.o. female.   Patient is a 19 year old female presents today for swelling to left middle finger around nailbed.  This is been present for the past couple days.  Tender to touch.  Denies any drainage.  Denies any injuries to the finger.     History reviewed. No pertinent past medical history.  Patient Active Problem List   Diagnosis Date Noted  . Encounter for management and injection of depo-Provera 11/19/2014    History reviewed. No pertinent surgical history.  OB History   No obstetric history on file.      Home Medications    Prior to Admission medications   Medication Sig Start Date End Date Taking? Authorizing Provider  benzonatate (TESSALON) 100 MG capsule Take 1-2 capsules (100-200 mg total) by mouth 3 (three) times daily as needed for cough. 10/30/19   Wallis Bamberg, PA-C  cephALEXin (KEFLEX) 500 MG capsule Take 1 capsule (500 mg total) by mouth 4 (four) times daily. 11/03/19   Dahlia Byes A, NP  ibuprofen (ADVIL) 600 MG tablet Take 1 tablet (600 mg total) by mouth every 8 (eight) hours as needed for moderate pain. 11/03/19   Dahlia Byes A, NP  promethazine-dextromethorphan (PROMETHAZINE-DM) 6.25-15 MG/5ML syrup Take 5 mLs by mouth at bedtime as needed for cough. 10/30/19   Wallis Bamberg, PA-C  omeprazole (PRILOSEC) 20 MG capsule Take 1 capsule (20 mg total) by mouth daily. 05/09/18 10/30/19  Janace Aris, NP    Family History Family History  Problem Relation Age of Onset  . Learning disabilities Sister   . Asthma Sister   . Asthma Brother   . Asthma Maternal Grandmother   . Alzheimer's disease Maternal Grandfather     Social History Social History   Tobacco Use  . Smoking status: Passive Smoke Exposure - Never Smoker  . Smokeless tobacco: Never  Used  . Tobacco comment: gma smokes in and out of the home   Substance Use Topics  . Alcohol use: No    Alcohol/week: 0.0 standard drinks  . Drug use: No     Allergies   Patient has no known allergies.   Review of Systems Review of Systems   Physical Exam Triage Vital Signs ED Triage Vitals  Enc Vitals Group     BP 11/03/19 1314 116/77     Pulse Rate 11/03/19 1314 90     Resp 11/03/19 1314 16     Temp 11/03/19 1314 99.4 F (37.4 C)     Temp Source 11/03/19 1314 Oral     SpO2 11/03/19 1314 100 %     Weight --      Height --      Head Circumference --      Peak Flow --      Pain Score 11/03/19 1358 0     Pain Loc --      Pain Edu? --      Excl. in GC? --    No data found.  Updated Vital Signs BP 116/77 (BP Location: Left Arm)   Pulse 90   Temp 99.4 F (37.4 C) (Oral)   Resp 16   SpO2 100%   Visual Acuity Right Eye Distance:   Left Eye Distance:  Bilateral Distance:    Right Eye Near:   Left Eye Near:    Bilateral Near:     Physical Exam Vitals and nursing note reviewed.  Constitutional:      General: She is not in acute distress.    Appearance: Normal appearance. She is not ill-appearing, toxic-appearing or diaphoretic.  HENT:     Head: Normocephalic.     Nose: Nose normal.  Eyes:     Conjunctiva/sclera: Conjunctivae normal.  Pulmonary:     Effort: Pulmonary effort is normal.  Musculoskeletal:        General: Normal range of motion.     Cervical back: Normal range of motion.  Skin:    General: Skin is warm and dry.     Findings: No rash.     Comments: Paronychia to left middle digit  Neurological:     Mental Status: She is alert.  Psychiatric:        Mood and Affect: Mood normal.      UC Treatments / Results  Labs (all labs ordered are listed, but only abnormal results are displayed) Labs Reviewed - No data to display  EKG   Radiology No results found.  Procedures Incision and Drainage  Date/Time: 11/06/2019 4:11  PM Performed by: Janace Aris, NP Authorized by: Janace Aris, NP   Consent:    Consent given by:  Patient   Risks discussed:  Bleeding, incomplete drainage and pain   Alternatives discussed:  No treatment, delayed treatment, alternative treatment and observation Location:    Indications for incision and drainage: Paronychia. Pre-procedure details:    Skin preparation:  Antiseptic wash Anesthesia (see MAR for exact dosages):    Anesthesia method:  Topical application Procedure type:    Complexity:  Simple Procedure details:    Incision types:  Stab incision   Incision depth:  Subungual   Scalpel blade:  11   Drainage:  Purulent   Drainage amount:  Moderate   Wound treatment:  Wound left open   Packing materials:  None Post-procedure details:    Patient tolerance of procedure:  Tolerated well, no immediate complications   (including critical care time)  Medications Ordered in UC Medications - No data to display  Initial Impression / Assessment and Plan / UC Course  I have reviewed the triage vital signs and the nursing notes.  Pertinent labs & imaging results that were available during my care of the patient were reviewed by me and considered in my medical decision making (see chart for details).     Paronychia to left little finger I&D here in clinic today. Recommended warm soaks and antibiotics as prescribed.  Ibuprofen for pain as needed. Follow up as needed for continued or worsening symptoms  Final Clinical Impressions(s) / UC Diagnoses   Final diagnoses:  Paronychia of left middle finger     Discharge Instructions     Warm soaks Antibiotics as prescribed Ibuprofen for pain as needed.  Follow up as needed for continued or worsening symptoms     ED Prescriptions    Medication Sig Dispense Auth. Provider   cephALEXin (KEFLEX) 500 MG capsule Take 1 capsule (500 mg total) by mouth 4 (four) times daily. 28 capsule Sheelah Ritacco A, NP   ibuprofen (ADVIL)  600 MG tablet Take 1 tablet (600 mg total) by mouth every 8 (eight) hours as needed for moderate pain. 30 tablet Dahlia Byes A, NP     PDMP not reviewed this encounter.   Michelle Chen,  Michelle Chen A, NP 11/06/19 1612

## 2019-11-20 DIAGNOSIS — Z3046 Encounter for surveillance of implantable subdermal contraceptive: Secondary | ICD-10-CM | POA: Diagnosis not present

## 2019-11-27 DIAGNOSIS — Z419 Encounter for procedure for purposes other than remedying health state, unspecified: Secondary | ICD-10-CM | POA: Diagnosis not present

## 2019-12-27 DIAGNOSIS — Z419 Encounter for procedure for purposes other than remedying health state, unspecified: Secondary | ICD-10-CM | POA: Diagnosis not present

## 2020-01-27 DIAGNOSIS — Z419 Encounter for procedure for purposes other than remedying health state, unspecified: Secondary | ICD-10-CM | POA: Diagnosis not present

## 2020-02-27 DIAGNOSIS — Z419 Encounter for procedure for purposes other than remedying health state, unspecified: Secondary | ICD-10-CM | POA: Diagnosis not present

## 2020-03-07 DIAGNOSIS — Z124 Encounter for screening for malignant neoplasm of cervix: Secondary | ICD-10-CM | POA: Diagnosis not present

## 2020-03-26 DIAGNOSIS — Z419 Encounter for procedure for purposes other than remedying health state, unspecified: Secondary | ICD-10-CM | POA: Diagnosis not present

## 2020-04-26 DIAGNOSIS — Z419 Encounter for procedure for purposes other than remedying health state, unspecified: Secondary | ICD-10-CM | POA: Diagnosis not present

## 2020-05-26 DIAGNOSIS — Z419 Encounter for procedure for purposes other than remedying health state, unspecified: Secondary | ICD-10-CM | POA: Diagnosis not present

## 2020-06-26 DIAGNOSIS — Z419 Encounter for procedure for purposes other than remedying health state, unspecified: Secondary | ICD-10-CM | POA: Diagnosis not present

## 2020-07-26 DIAGNOSIS — Z419 Encounter for procedure for purposes other than remedying health state, unspecified: Secondary | ICD-10-CM | POA: Diagnosis not present

## 2020-08-26 DIAGNOSIS — Z419 Encounter for procedure for purposes other than remedying health state, unspecified: Secondary | ICD-10-CM | POA: Diagnosis not present

## 2020-09-26 DIAGNOSIS — Z419 Encounter for procedure for purposes other than remedying health state, unspecified: Secondary | ICD-10-CM | POA: Diagnosis not present

## 2020-10-26 DIAGNOSIS — Z419 Encounter for procedure for purposes other than remedying health state, unspecified: Secondary | ICD-10-CM | POA: Diagnosis not present

## 2020-11-02 ENCOUNTER — Other Ambulatory Visit: Payer: Self-pay

## 2020-11-02 ENCOUNTER — Encounter (HOSPITAL_COMMUNITY): Payer: Self-pay | Admitting: Emergency Medicine

## 2020-11-02 ENCOUNTER — Ambulatory Visit (HOSPITAL_COMMUNITY)
Admission: EM | Admit: 2020-11-02 | Discharge: 2020-11-02 | Disposition: A | Discharge status: Home / Self Care | Payer: Medicaid Other | Attending: Physician Assistant | Admitting: Physician Assistant

## 2020-11-02 DIAGNOSIS — K529 Noninfective gastroenteritis and colitis, unspecified: Secondary | ICD-10-CM | POA: Diagnosis not present

## 2020-11-02 DIAGNOSIS — R110 Nausea: Secondary | ICD-10-CM

## 2020-11-02 DIAGNOSIS — R197 Diarrhea, unspecified: Secondary | ICD-10-CM

## 2020-11-02 LAB — POC URINE PREG, ED: Preg Test, Ur: NEGATIVE

## 2020-11-02 LAB — POCT URINALYSIS DIPSTICK, ED / UC
Bilirubin Urine: NEGATIVE
Glucose, UA: NEGATIVE mg/dL
Hgb urine dipstick: NEGATIVE
Ketones, ur: NEGATIVE mg/dL
Leukocytes,Ua: NEGATIVE
Nitrite: NEGATIVE
Protein, ur: NEGATIVE mg/dL
Specific Gravity, Urine: 1.025 (ref 1.005–1.030)
Urobilinogen, UA: 0.2 mg/dL (ref 0.0–1.0)
pH: 6 (ref 5.0–8.0)

## 2020-11-02 MED ORDER — FAMOTIDINE 40 MG PO TABS
40.0000 mg | ORAL_TABLET | Freq: Every day | ORAL | 0 refills | Status: DC
Start: 2020-11-02 — End: 2020-11-21

## 2020-11-02 NOTE — ED Provider Notes (Signed)
[facility]    CSN: [id number] Arrival date & time: 11/02/20  1529      History   Chief Complaint Chief Complaint  Patient presents with   Nausea   Diarrhea    HPI Michelle Chen is a 20 y.o. female.   Patient presents today with a 3-day history of GI symptoms.  She is concerned that she had food poisoning as symptoms began soon after eating some abnormal looking chicken at [employer].  She developed abdominal pain which she describes as cramping worse in upper abdomen but generalized throughout abdomen, no aggravating leaving factors identified.  Reports that abdominal pain symptoms have significantly improved.  She reports associated nausea but denies any vomiting.  She denies any fever, cough, ingestion, body aches, fatigue, malaise.  She has been experiencing loose/watery stools that are very frequent.  She reports 1 episode of dark stools after taking Pepto-Bismol but denies any blood or ongoing melena.  She has not tried any other medications besides Pepto-Bismol.  Denies any recent travel, medication changes, antibiotic use, known sick contacts.  She denies history of gastrointestinal disorder.  Denies history of abdominal surgery.   History reviewed. No pertinent past medical history.  Patient Active Problem List   Diagnosis Date Noted   Encounter for management and injection of depo-Provera 11/19/2014    History reviewed. No pertinent surgical history.  OB History   No obstetric history on file.      Home Medications    Prior to Admission medications   Medication Sig Start Date End Date Taking? Authorizing Provider  famotidine (PEPCID) 40 MG tablet Take 1 tablet (40 mg total) by mouth at bedtime. 11/02/20  Yes Elic Vencill, Denny Peon K, PA-C  omeprazole (PRILOSEC) 20 MG capsule Take 1 capsule (20 mg total) by mouth daily. 05/09/18 10/30/19  Janace Aris, NP    Family History Family History  Problem Relation Age of Onset   Learning disabilities Sister    Asthma  Sister    Asthma Brother    Asthma Maternal Grandmother    Alzheimer's disease Maternal Grandfather     Social History Social History   Tobacco Use   Smoking status: Passive Smoke Exposure - Never Smoker   Smokeless tobacco: Never   Tobacco comments:    gma smokes in and out of the home   Substance Use Topics   Alcohol use: No    Alcohol/week: 0.0 standard drinks   Drug use: No     Allergies   Patient has no known allergies.   Review of Systems Review of Systems  Constitutional:  Positive for appetite change. Negative for activity change, fatigue and fever.  Respiratory:  Negative for cough and shortness of breath.   Cardiovascular:  Negative for chest pain.  Gastrointestinal:  Positive for abdominal pain, diarrhea and nausea. Negative for vomiting.  Genitourinary:  Negative for dysuria, frequency, urgency, vaginal bleeding, vaginal discharge and vaginal pain.  Musculoskeletal:  Negative for arthralgias and myalgias.  Neurological:  Negative for dizziness, light-headedness and headaches.    Physical Exam Triage Vital Signs ED Triage Vitals  Enc Vitals Group     BP 11/02/20 1633 113/77     Pulse Rate 11/02/20 1633 (!) 59     Resp 11/02/20 1633 18     Temp 11/02/20 1633 98.2 F (36.8 C)     Temp src --      SpO2 11/02/20 1633 97 %     Weight --      Height --  Head Circumference --      Peak Flow --      Pain Score 11/02/20 1631 0     Pain Loc --      Pain Edu? --      Excl. in GC? --    No data found.  Updated Vital Signs BP 113/77   Pulse (!) 59   Temp 98.2 F (36.8 C)   Resp 18   SpO2 97%   Visual Acuity Right Eye Distance:   Left Eye Distance:   Bilateral Distance:    Right Eye Near:   Left Eye Near:    Bilateral Near:     Physical Exam Vitals reviewed.  Constitutional:      General: She is awake. She is not in acute distress.    Appearance: Normal appearance. She is well-developed. She is not ill-appearing.     Comments: Very  pleasant female appears stated age in no acute distress sitting comfortably in exam room  HENT:     Head: Normocephalic and atraumatic.     Mouth/Throat:     Mouth: Mucous membranes are moist.     Pharynx: Uvula midline. No oropharyngeal exudate or posterior oropharyngeal erythema.  Cardiovascular:     Rate and Rhythm: Normal rate and regular rhythm.     Heart sounds: Normal heart sounds, S1 normal and S2 normal. No murmur heard. Pulmonary:     Effort: Pulmonary effort is normal.     Breath sounds: Normal breath sounds. No wheezing, rhonchi or rales.     Comments: Clear to auscultation bilaterally Abdominal:     General: Bowel sounds are normal.     Palpations: Abdomen is soft.     Tenderness: There is abdominal tenderness in the epigastric area and left upper quadrant. There is no right CVA tenderness, left CVA tenderness, guarding or rebound.     Comments: Benign abdominal exam; mild tender to palpation in epigastrium and left upper quadrant but no evidence of acute abdomen  Psychiatric:        Behavior: Behavior is cooperative.     UC Treatments / Results  Labs (all labs ordered are listed, but only abnormal results are displayed) Labs Reviewed  POCT URINALYSIS DIPSTICK, ED / UC  POC URINE PREG, ED    EKG   Radiology No results found.  Procedures Procedures (including critical care time)  Medications Ordered in UC Medications - No data to display  Initial Impression / Assessment and Plan / UC Course  I have reviewed the triage vital signs and the nursing notes.  Pertinent labs & imaging results that were available during my care of the patient were reviewed by me and considered in my medical decision making (see chart for details).      Vital signs and physical exam are reassuring today; no indication for emergent evaluation or imaging.  Urine pregnancy test was negative.  UA was normal with no evidence of dehydration.  Discussed that symptoms are likely related  to viral illness.  Recommended she use Pepcid to help coat her stomach.  Offered Zofran for nausea but she declined this as nausea symptoms are not severe.  Recommended she eat a bland diet and drink plenty of fluid.  Discussed that we do not have imaging capabilities in urgent care and if she has recurrent abdominal pain she needs to go to the emergency room.  Discussed alarm symptoms that warrant emergent evaluation.  Strict return precautions given to which she expressed understanding.  Final Clinical  Impressions(s) / UC Diagnoses   Final diagnoses:  Nausea without vomiting  Diarrhea, unspecified type  Gastroenteritis     Discharge Instructions      You are not dehydrated based on your urine today.  I think you have a virus.  Make sure that you are eating a bland diet and drinking plenty of fluid.  Start Pepcid at night to help with acid symptoms.  If you have any worsening symptoms including recurrent abdominal pain, vomiting, persistent diarrhea, blood in your stool, fever you need to be reevaluated as we discussed.     ED Prescriptions     Medication Sig Dispense Auth. Provider   famotidine (PEPCID) 40 MG tablet Take 1 tablet (40 mg total) by mouth at bedtime. 30 tablet Vidalia Serpas, Noberto Retort, PA-C      PDMP not reviewed this encounter.   Jeani Hawking, PA-C 11/02/20 1703

## 2020-11-02 NOTE — Discharge Instructions (Addendum)
You are not dehydrated based on your urine today.  I think you have a virus.  Make sure that you are eating a bland diet and drinking plenty of fluid.  Start Pepcid at night to help with acid symptoms.  If you have any worsening symptoms including recurrent abdominal pain, vomiting, persistent diarrhea, blood in your stool, fever you need to be reevaluated as we discussed.

## 2020-11-02 NOTE — ED Triage Notes (Signed)
Pt is present today with diarrhea and nausea. Pt states sx started 10/5

## 2020-11-21 ENCOUNTER — Ambulatory Visit (HOSPITAL_COMMUNITY)
Admission: EM | Admit: 2020-11-21 | Discharge: 2020-11-21 | Disposition: A | Discharge status: Home / Self Care | Payer: Medicaid Other | Attending: Internal Medicine | Admitting: Internal Medicine

## 2020-11-21 ENCOUNTER — Other Ambulatory Visit: Payer: Self-pay

## 2020-11-21 ENCOUNTER — Encounter (HOSPITAL_COMMUNITY): Payer: Self-pay | Admitting: *Deleted

## 2020-11-21 DIAGNOSIS — R8290 Unspecified abnormal findings in urine: Secondary | ICD-10-CM | POA: Insufficient documentation

## 2020-11-21 DIAGNOSIS — R1030 Lower abdominal pain, unspecified: Secondary | ICD-10-CM | POA: Insufficient documentation

## 2020-11-21 DIAGNOSIS — Z3201 Encounter for pregnancy test, result positive: Secondary | ICD-10-CM | POA: Insufficient documentation

## 2020-11-21 DIAGNOSIS — N926 Irregular menstruation, unspecified: Secondary | ICD-10-CM | POA: Insufficient documentation

## 2020-11-21 LAB — HIV ANTIBODY (ROUTINE TESTING W REFLEX): HIV Screen 4th Generation wRfx: NONREACTIVE

## 2020-11-21 LAB — POCT URINALYSIS DIPSTICK, ED / UC
Bilirubin Urine: NEGATIVE
Glucose, UA: NEGATIVE mg/dL
Hgb urine dipstick: NEGATIVE
Ketones, ur: 15 mg/dL — AB
Nitrite: NEGATIVE
Protein, ur: NEGATIVE mg/dL
Specific Gravity, Urine: 1.02 (ref 1.005–1.030)
Urobilinogen, UA: 1 mg/dL (ref 0.0–1.0)
pH: 7 (ref 5.0–8.0)

## 2020-11-21 LAB — HEPATITIS C ANTIBODY: HCV Ab: NONREACTIVE

## 2020-11-21 LAB — POC URINE PREG, ED: Preg Test, Ur: POSITIVE — AB

## 2020-11-21 MED ORDER — CEPHALEXIN 500 MG PO CAPS
500.0000 mg | ORAL_CAPSULE | Freq: Four times a day (QID) | ORAL | 0 refills | Status: DC
Start: 2020-11-21 — End: 2021-01-14

## 2020-11-21 NOTE — ED Provider Notes (Signed)
[facility]    CSN: [id number] Arrival date & time: 11/21/20  1812      History   Chief Complaint Chief Complaint  Patient presents with   Abdominal Pain    HPI Michelle Chen is a 20 y.o. female.   Patient presents today with several day history of intermittent lower abdominal pain.  Reports during episode pain is rated 7.5/8 on a 0-10 pain scale, described as cramping, no aggravating or alleviating factors identified.  She has not tried any over-the-counter medication for symptom management.  She denies any history of UTI, nephrolithiasis, recent urogenital procedure, self-catheterization.  Denies any antibiotic use.  She denies any significant vaginal symptoms including discharge or discomfort but is interested in STI testing including blood test for HIV/hepatitis/syphilis.  She is not currently on any birth control was noted to have positive urine pregnancy in clinic.  Reports LMP 10/18/2020.  She does not take any regular over-the-counter medication.  Denies history of smoking or drug use.   History reviewed. No pertinent past medical history.  Patient Active Problem List   Diagnosis Date Noted   Encounter for management and injection of depo-Provera 11/19/2014    History reviewed. No pertinent surgical history.  OB History   No obstetric history on file.      Home Medications    Prior to Admission medications   Medication Sig Start Date End Date Taking? Authorizing Provider  cephALEXin (KEFLEX) 500 MG capsule Take 1 capsule (500 mg total) by mouth 4 (four) times daily. 11/21/20  Yes Arianah Torgeson, Denny Peon K, PA-C  omeprazole (PRILOSEC) 20 MG capsule Take 1 capsule (20 mg total) by mouth daily. 05/09/18 10/30/19  Janace Aris, NP    Family History Family History  Problem Relation Age of Onset   Learning disabilities Sister    Asthma Sister    Asthma Brother    Asthma Maternal Grandmother    Alzheimer's disease Maternal Grandfather     Social  History Social History   Tobacco Use   Smoking status: Passive Smoke Exposure - Never Smoker   Smokeless tobacco: Never   Tobacco comments:    gma smokes in and out of the home   Substance Use Topics   Alcohol use: No    Alcohol/week: 0.0 standard drinks   Drug use: No     Allergies   Patient has no known allergies.   Review of Systems Review of Systems  Constitutional:  Negative for activity change, appetite change, fatigue and fever.  Respiratory:  Negative for cough and shortness of breath.   Cardiovascular:  Negative for chest pain.  Gastrointestinal:  Positive for abdominal pain (lower). Negative for diarrhea, nausea and vomiting.  Genitourinary:  Negative for dysuria, frequency, hematuria, pelvic pain, urgency, vaginal bleeding, vaginal discharge and vaginal pain.  Neurological:  Negative for dizziness, light-headedness and headaches.    Physical Exam Triage Vital Signs ED Triage Vitals  Enc Vitals Group     BP 11/21/20 1901 122/79     Pulse Rate 11/21/20 1901 84     Resp 11/21/20 1901 18     Temp 11/21/20 1901 98.7 F (37.1 C)     Temp src --      SpO2 11/21/20 1901 100 %     Weight --      Height --      Head Circumference --      Peak Flow --      Pain Score 11/21/20 1857 8     Pain  Loc --      Pain Edu? --      Excl. in GC? --    No data found.  Updated Vital Signs BP 122/79   Pulse 84   Temp 98.7 F (37.1 C)   Resp 18   LMP 10/22/2020   SpO2 100%   Visual Acuity Right Eye Distance:   Left Eye Distance:   Bilateral Distance:    Right Eye Near:   Left Eye Near:    Bilateral Near:     Physical Exam Vitals reviewed.  Constitutional:      General: She is awake. She is not in acute distress.    Appearance: Normal appearance. She is well-developed. She is not ill-appearing.     Comments: Very pleasant female appears stated age in no acute distress sitting comfortably in exam room  HENT:     Head: Normocephalic and atraumatic.   Cardiovascular:     Rate and Rhythm: Normal rate and regular rhythm.     Heart sounds: Normal heart sounds, S1 normal and S2 normal. No murmur heard. Pulmonary:     Effort: Pulmonary effort is normal.     Breath sounds: Normal breath sounds. No wheezing, rhonchi or rales.     Comments: Clear to auscultation bilaterally Abdominal:     General: Bowel sounds are normal.     Palpations: Abdomen is soft.     Tenderness: There is no abdominal tenderness. There is no right CVA tenderness, left CVA tenderness, guarding or rebound.     Comments: Benign abdominal exam  Genitourinary:    Comments: Exam deferred Psychiatric:        Behavior: Behavior is cooperative.     UC Treatments / Results  Labs (all labs ordered are listed, but only abnormal results are displayed) Labs Reviewed  POCT URINALYSIS DIPSTICK, ED / UC - Abnormal; Notable for the following components:      Result Value   Ketones, ur 15 (*)    Leukocytes,Ua TRACE (*)    All other components within normal limits  POC URINE PREG, ED - Abnormal; Notable for the following components:   Preg Test, Ur POSITIVE (*)    All other components within normal limits  URINE CULTURE  HIV ANTIBODY (ROUTINE TESTING W REFLEX)  RPR  HEPATITIS C ANTIBODY  CERVICOVAGINAL ANCILLARY ONLY    EKG   Radiology No results found.  Procedures Procedures (including critical care time)  Medications Ordered in UC Medications - No data to display  Initial Impression / Assessment and Plan / UC Course  I have reviewed the triage vital signs and the nursing notes.  Pertinent labs & imaging results that were available during my care of the patient were reviewed by me and considered in my medical decision making (see chart for details).     Urine pregnancy was positive in clinic today.  Patient denies any current abdominal pain or pelvic cramping.  Patient does not take medication on a regular basis.  Discussed that she should not take  over-the-counter medications except for Tylenol as needed but to limit all medications in the first trimester.  She does not have an OB/GYN and was given contact information for local provider to call and schedule an appointment.  STI testing was obtained but will defer treatment until results are available.  Patient did have leukocyte esterase on urine and so we will treat with Keflex given lower abdominal pain to cover for UTI.  Culture was sent and we will contact  her if any change antibiotics based on culture results.  Recommend she rest and drink plenty of fluid.  Discussed that if she has any alarm symptoms including abdominal pain or vaginal bleeding she needs to go to the emergency room.  Strict return precautions given to which she expressed understanding.  Final Clinical Impressions(s) / UC Diagnoses   Final diagnoses:  Lower abdominal pain  Positive urine pregnancy test  Missed menses  Abnormal urinalysis     Discharge Instructions      You are pregnant.  Please follow-up with OB/GYN as we discussed.  We will contact you if your lab work is abnormal.  We are calling in an antibiotic (Keflex) that you should take 4 times a day.  If we need to change the antibiotic based on your culture results we will contact you.  Make sure you are drinking plenty of fluid.  As we discussed, you should not take over-the-counter medications as much as possible and are limited to Tylenol for fever and pain.  Make sure to only eat cooked foods and eat up lunch meats.  Avoid raising her core body temperature by going on a sauna or hot tub.  If you have any abdominal pain, vaginal bleeding, severe cramping you need to go to the emergency room as we discussed.     ED Prescriptions     Medication Sig Dispense Auth. Provider   cephALEXin (KEFLEX) 500 MG capsule Take 1 capsule (500 mg total) by mouth 4 (four) times daily. 20 capsule Clarine Elrod, Noberto Retort, PA-C      PDMP not reviewed this encounter.   Jeani Hawking, PA-C 11/21/20 1926

## 2020-11-21 NOTE — Discharge Instructions (Signed)
You are pregnant.  Please follow-up with OB/GYN as we discussed.  We will contact you if your lab work is abnormal.  We are calling in an antibiotic (Keflex) that you should take 4 times a day.  If we need to change the antibiotic based on your culture results we will contact you.  Make sure you are drinking plenty of fluid.  As we discussed, you should not take over-the-counter medications as much as possible and are limited to Tylenol for fever and pain.  Make sure to only eat cooked foods and eat up lunch meats.  Avoid raising her core body temperature by going on a sauna or hot tub.  If you have any abdominal pain, vaginal bleeding, severe cramping you need to go to the emergency room as we discussed.

## 2020-11-21 NOTE — ED Triage Notes (Signed)
Pt reports ABD pain for last couple of days. Pt denies N/V.

## 2020-11-22 LAB — CERVICOVAGINAL ANCILLARY ONLY
Bacterial Vaginitis (gardnerella): NEGATIVE
Candida Glabrata: NEGATIVE
Candida Vaginitis: POSITIVE — AB
Chlamydia: NEGATIVE
Comment: NEGATIVE
Comment: NEGATIVE
Comment: NEGATIVE
Comment: NEGATIVE
Comment: NEGATIVE
Comment: NORMAL
Neisseria Gonorrhea: NEGATIVE
Trichomonas: NEGATIVE

## 2020-11-22 LAB — URINE CULTURE: Culture: 60000 — AB

## 2020-11-22 LAB — RPR: RPR Ser Ql: NONREACTIVE

## 2020-11-23 ENCOUNTER — Telehealth (HOSPITAL_COMMUNITY): Payer: Self-pay | Admitting: Emergency Medicine

## 2020-11-23 MED ORDER — CLOTRIMAZOLE 1 % VA CREA
1.0000 | TOPICAL_CREAM | Freq: Every day | VAGINAL | 0 refills | Status: DC
Start: 2020-11-23 — End: 2021-01-14

## 2020-11-23 MED ORDER — FLUCONAZOLE 150 MG PO TABS
150.0000 mg | ORAL_TABLET | Freq: Once | ORAL | 0 refills | Status: DC
Start: 2020-11-23 — End: 2020-11-23

## 2020-11-25 DIAGNOSIS — Z3491 Encounter for supervision of normal pregnancy, unspecified, first trimester: Secondary | ICD-10-CM | POA: Diagnosis not present

## 2020-11-25 DIAGNOSIS — Z23 Encounter for immunization: Secondary | ICD-10-CM | POA: Diagnosis not present

## 2020-11-26 DIAGNOSIS — Z419 Encounter for procedure for purposes other than remedying health state, unspecified: Secondary | ICD-10-CM | POA: Diagnosis not present

## 2020-12-26 DIAGNOSIS — Z419 Encounter for procedure for purposes other than remedying health state, unspecified: Secondary | ICD-10-CM | POA: Diagnosis not present

## 2020-12-31 ENCOUNTER — Telehealth (INDEPENDENT_AMBULATORY_CARE_PROVIDER_SITE_OTHER): Payer: Medicaid Other

## 2020-12-31 DIAGNOSIS — Z3A00 Weeks of gestation of pregnancy not specified: Secondary | ICD-10-CM

## 2020-12-31 DIAGNOSIS — O0990 Supervision of high risk pregnancy, unspecified, unspecified trimester: Secondary | ICD-10-CM | POA: Insufficient documentation

## 2020-12-31 DIAGNOSIS — Z3400 Encounter for supervision of normal first pregnancy, unspecified trimester: Secondary | ICD-10-CM | POA: Insufficient documentation

## 2020-12-31 DIAGNOSIS — Z3401 Encounter for supervision of normal first pregnancy, first trimester: Secondary | ICD-10-CM

## 2020-12-31 MED ORDER — PRENATAL 27-1 MG PO TABS: 1.0000 | ORAL_TABLET | Freq: Every day | ORAL | 0 refills | Status: DC

## 2020-12-31 MED ORDER — BLOOD PRESSURE KIT DEVI: 1.0000 | Freq: Every day | 0 refills | Status: DC

## 2020-12-31 NOTE — Progress Notes (Signed)
New OB Intake  I connected with  Michelle Chen on 12/31/20 at  8:15 AM EST by MyChart Video Visit and verified that I am speaking with the correct person using two identifiers. Nurse is located at [facility] and pt is located at home.  I discussed the limitations, risks, security and privacy concerns of performing an evaluation and management service by telephone and the availability of in person appointments. I also discussed with the patient that there may be a patient responsible charge related to this service. The patient expressed understanding and agreed to proceed.  I explained I am completing New OB Intake today. We discussed her EDD of 07/29/2021 that is based on LMP of 10/22/20. Pt is G1/P0. I reviewed her allergies, medications, Medical/Surgical/OB history, and appropriate screenings. I informed her of [facility] services. Based on history, this is a/an  pregnancy uncomplicated .   Patient Active Problem List   Diagnosis Date Noted   Encounter for management and injection of depo-Provera 11/19/2014    Concerns addressed today  Delivery Plans:  Plans to deliver at [facility].   MyChart/Babyscripts MyChart access verified. I explained pt will have some visits in office and some virtually. Babyscripts instructions given and order placed. Patient verifies receipt of registration text/e-mail. Account successfully created and app downloaded.  Blood Pressure Cuff  Blood pressure cuff ordered for patient to pick-up from [employer]. Explained after first prenatal appt pt will check weekly and document in Babyscripts.  Weight scale: Patient    have weight scale. Weight scale ordered for patient to pick up form [facility].   Anatomy US Explained first scheduled [place] will be around 19 weeks. Anatomy US scheduled for 03/10/21 at 0845. Pt notified to arrive at 0830.  Labs Discussed Avelina Laine genetic screening with patient. Would like both Panorama and Horizon drawn at new OB visit. Routine  prenatal labs needed.  Covid Vaccine Patient has not covid vaccine.   Centering in Pregnancy Candidate?  If yes, offer as possibility- Declined  Mother/ Baby Dyad Candidate?    If yes, offer as possibility- Accepted   Informed patient of [facility] website  and placed link in her AVS.   Social Determinants of Health Food Insecurity: Patient denies food insecurity. WIC Referral: Patient is interested in referral to [facility].  Transportation: Patient denies transportation needs. Childcare: Discussed no children allowed at ultrasound appointments. Offered childcare services; patient declines childcare services at this time.  Send link to Pregnancy Navigators   Placed OB Box on problem list and updated  First visit review I reviewed new OB appt with pt. I explained she will have a pelvic exam, ob bloodwork with genetic screening, and PAP smear. Explained pt will be seen by Dr. Crissie Reese at first visit; encounter routed to appropriate provider. Explained that patient will be seen by pregnancy navigator following visit with provider. [facility] information placed in AVS.   Aviva Signs, CMA 12/31/2020  8:12 AM

## 2021-01-14 ENCOUNTER — Ambulatory Visit (INDEPENDENT_AMBULATORY_CARE_PROVIDER_SITE_OTHER): Payer: Medicaid Other | Admitting: Family Medicine

## 2021-01-14 ENCOUNTER — Other Ambulatory Visit: Payer: Self-pay

## 2021-01-14 ENCOUNTER — Encounter: Payer: Medicaid Other | Admitting: Family Medicine

## 2021-01-14 ENCOUNTER — Other Ambulatory Visit (HOSPITAL_COMMUNITY)
Admission: RE | Admit: 2021-01-14 | Discharge: 2021-01-14 | Disposition: A | Discharge status: Home / Self Care | Payer: Medicaid Other | Source: Ambulatory Visit | Attending: Family Medicine | Admitting: Family Medicine

## 2021-01-14 VITALS — BP 115/80 | HR 99 | Wt 181.4 lb

## 2021-01-14 DIAGNOSIS — Z3401 Encounter for supervision of normal first pregnancy, first trimester: Secondary | ICD-10-CM

## 2021-01-14 NOTE — Progress Notes (Signed)
Subjective:   Michelle Chen is a 20 y.o. G1P0000 at 51w0dby LMP being seen today for her first obstetrical visit.  Her obstetrical history is significant for  n/a . Patient does not intend to breast feed. Pregnancy history fully reviewed.  Patient reports no complaints.  HISTORY: OB History  Gravida Para Term Preterm AB Living  1 0 0 0 0 0  SAB IAB Ectopic Multiple Live Births  0 0 0 0 0    # Outcome Date GA Lbr Len/2nd Weight Sex Delivery Anes PTL Lv  1 Current              Last pap smear: No results found for: DIAGPAP, HPV, HPVHIGH  Reports normal path with +HPV last year, will do ROI  Past Medical History:  Diagnosis Date   Medical history non-contributory    Past Surgical History:  Procedure Laterality Date   NO PAST SURGERIES     Family History  Problem Relation Age of Onset   Learning disabilities Sister    Asthma Sister    Asthma Brother    Asthma Maternal Grandmother    Alzheimer's disease Maternal Grandfather    Social History   Tobacco Use   Smoking status: Never    Passive exposure: Past   Smokeless tobacco: Never   Tobacco comments:    gma smokes in and out of the home   Substance Use Topics   Alcohol use: No    Alcohol/week: 0.0 standard drinks   Drug use: No   No Known Allergies Current Outpatient Medications on File Prior to Visit  Medication Sig Dispense Refill   Blood Pressure Monitoring (BLOOD PRESSURE KIT) DEVI 1 Device by Does not apply route daily. 1 each 0   Prenatal 27-1 MG TABS Take 1 tablet by mouth daily. 30 tablet 0   [DISCONTINUED] omeprazole (PRILOSEC) 20 MG capsule Take 1 capsule (20 mg total) by mouth daily. 30 capsule 0   No current facility-administered medications on file prior to visit.     Exam   Vitals:   01/14/21 0830  BP: 115/80  Pulse: 99  Weight: 181 lb 6.4 oz (82.3 kg)      System: General: well-developed, well-nourished female in no acute distress   Skin: normal coloration and turgor, no  rashes   Neurologic: oriented, normal, negative, normal mood   Extremities: normal strength, tone, and muscle mass, ROM of all joints is normal   HEENT PERRLA, extraocular movement intact and sclera clear, anicteric   Neck supple and no masses   Respiratory:  no respiratory distress      Assessment:   Pregnancy: G1P0000 Patient Active Problem List   Diagnosis Date Noted   Supervision of low-risk first pregnancy 12/31/2020   Encounter for management and injection of depo-Provera 11/19/2014     Plan:  1. Encounter for supervision of low-risk first pregnancy in first trimester Initial labs drawn. Continue prenatal vitamins. Genetic Screening discussed, NIPS: ordered. Ultrasound discussed; fetal anatomic survey: ordered. Problem list reviewed and updated. The nature of Dyad/Family Care clinic was explained to patient; Voiced they may need to be seen by other [facility] which includes family medicine physicians, OB GYNs, and APPs. Delivery will hopefully be with one of the Dyad providers or another [facility] physician and we cannot promise this at this time.  Discussed there are [facility] and they understand and support this model and there is a likelihood one of these providers  will catch their baby.  We also discussed that the service includes learners (residents, student) and they will be involved in the care team.    Routine obstetric precautions reviewed. Return in 4 weeks (on 02/11/2021) for Dyad patient, ob visit.

## 2021-01-14 NOTE — Patient Instructions (Signed)

## 2021-01-15 LAB — CBC/D/PLT+RPR+RH+ABO+RUBIGG...
Antibody Screen: NEGATIVE
Basophils Absolute: 0 10*3/uL (ref 0.0–0.2)
Basos: 0 %
EOS (ABSOLUTE): 0 10*3/uL (ref 0.0–0.4)
Eos: 0 %
HCV Ab: <0.1 s/co ratio (ref 0.0–0.9)
HIV Screen 4th Generation wRfx: NONREACTIVE
Hematocrit: 38.1 % (ref 34.0–46.6)
Hemoglobin: 13.1 g/dL (ref 11.1–15.9)
Hepatitis B Surface Ag: NEGATIVE
Immature Grans (Abs): 0 10*3/uL (ref 0.0–0.1)
Immature Granulocytes: 0 %
Lymphocytes Absolute: 1.9 10*3/uL (ref 0.7–3.1)
Lymphs: 16 %
MCH: 31.6 pg (ref 26.6–33.0)
MCHC: 34.4 g/dL (ref 31.5–35.7)
MCV: 92 fL (ref 79–97)
Monocytes Absolute: 0.8 10*3/uL (ref 0.1–0.9)
Monocytes: 7 %
Neutrophils Absolute: 9 10*3/uL — ABNORMAL HIGH (ref 1.4–7.0)
Neutrophils: 77 %
Platelets: 311 10*3/uL (ref 150–450)
RBC: 4.14 x10E6/uL (ref 3.77–5.28)
RDW: 12.6 % (ref 11.7–15.4)
RPR Ser Ql: NONREACTIVE
Rh Factor: POSITIVE
Rubella Antibodies, IGG: 6.26 index (ref >0.99)
WBC: 11.8 10*3/uL — ABNORMAL HIGH (ref 3.4–10.8)

## 2021-01-15 LAB — GC/CHLAMYDIA PROBE AMP (~~LOC~~) NOT AT ARMC
Chlamydia: NEGATIVE
Comment: NEGATIVE
Comment: NORMAL
Neisseria Gonorrhea: NEGATIVE

## 2021-01-15 LAB — HEMOGLOBIN A1C
Est. average glucose Bld gHb Est-mCnc: 103 mg/dL
Hgb A1c MFr Bld: 5.2 % (ref 4.8–5.6)

## 2021-01-15 LAB — HCV INTERPRETATION

## 2021-01-16 LAB — CULTURE, OB URINE

## 2021-01-16 LAB — URINE CULTURE, OB REFLEX

## 2021-01-26 DIAGNOSIS — Z419 Encounter for procedure for purposes other than remedying health state, unspecified: Secondary | ICD-10-CM | POA: Diagnosis not present

## 2021-02-12 ENCOUNTER — Other Ambulatory Visit: Payer: Self-pay

## 2021-02-12 ENCOUNTER — Ambulatory Visit (INDEPENDENT_AMBULATORY_CARE_PROVIDER_SITE_OTHER): Payer: Medicaid Other | Admitting: Family Medicine

## 2021-02-12 VITALS — BP 130/81 | HR 97 | Wt 190.8 lb

## 2021-02-12 DIAGNOSIS — Z3401 Encounter for supervision of normal first pregnancy, first trimester: Secondary | ICD-10-CM | POA: Diagnosis not present

## 2021-02-12 NOTE — Progress Notes (Signed)
° ° °  Subjective:  Michelle Chen is a 21 y.o. G1P0000 at [redacted]w[redacted]d being seen today for ongoing prenatal care.  She is currently monitored for the following issues for this low-risk pregnancy and has Encounter for management and injection of depo-Provera and Supervision of low-risk first pregnancy on their problem list.  Patient reports no complaints.  Contractions: Not present. Vag. Bleeding: None.  Movement: Absent. Denies leaking of fluid.   The following portions of the patient's history were reviewed and updated as appropriate: allergies, current medications, past family history, past medical history, past social history, past surgical history and problem list. Problem list updated.  Objective:   Vitals:   02/12/21 1504  BP: 130/81  Pulse: 97  Weight: 190 lb 12.8 oz (86.5 kg)    Fetal Status: Fetal Heart Rate (bpm): 154   Movement: Absent     General:  Alert, oriented and cooperative. Patient is in no acute distress.  Skin: Skin is warm and dry. No rash noted.   Cardiovascular: Normal heart rate noted  Respiratory: Normal respiratory effort, no problems with respiration noted  Abdomen: Soft, gravid, appropriate for gestational age. Pain/Pressure: Absent     Pelvic: Vag. Bleeding: None     Cervical exam deferred        Extremities: Normal range of motion.  Edema: None  Mental Status: Normal mood and affect. Normal behavior. Normal judgment and thought content.   Urinalysis:      Assessment and Plan:  Pregnancy: G1P0000 at [redacted]w[redacted]d  1. Encounter for supervision of low-risk first pregnancy in first trimester BP and FHR normal AFP today Anatomy scan scheduled for 03/10/2021  Preterm labor symptoms and general obstetric precautions including but not limited to vaginal bleeding, contractions, leaking of fluid and fetal movement were reviewed in detail with the patient. Please refer to After Visit Summary for other counseling recommendations.  Return in about 4 weeks (around  03/12/2021) for Dyad patient, ob visit.   Venora Maples, MD

## 2021-02-13 LAB — AFP, SERUM, OPEN SPINA BIFIDA
AFP MoM: 1.25
AFP Value: 42.2 ng/mL
Gest. Age on Collection Date: 16.1 weeks
Maternal Age At EDD: 22.6 yr
OSBR Risk 1 IN: 10000
Test Results:: NEGATIVE
Weight: 190 [lb_av]

## 2021-02-26 DIAGNOSIS — Z419 Encounter for procedure for purposes other than remedying health state, unspecified: Secondary | ICD-10-CM | POA: Diagnosis not present

## 2021-02-27 ENCOUNTER — Encounter: Payer: Self-pay | Admitting: Family Medicine

## 2021-03-03 ENCOUNTER — Encounter: Payer: Self-pay | Admitting: Family Medicine

## 2021-03-10 ENCOUNTER — Ambulatory Visit: Payer: Medicaid Other | Attending: Family Medicine

## 2021-03-10 ENCOUNTER — Other Ambulatory Visit: Payer: Self-pay | Admitting: Family Medicine

## 2021-03-10 ENCOUNTER — Other Ambulatory Visit: Payer: Self-pay

## 2021-03-10 ENCOUNTER — Ambulatory Visit: Payer: Medicaid Other | Admitting: *Deleted

## 2021-03-10 ENCOUNTER — Encounter: Payer: Self-pay | Admitting: *Deleted

## 2021-03-10 ENCOUNTER — Other Ambulatory Visit: Payer: Self-pay | Admitting: *Deleted

## 2021-03-10 VITALS — BP 120/72 | HR 101

## 2021-03-10 DIAGNOSIS — Z363 Encounter for antenatal screening for malformations: Secondary | ICD-10-CM | POA: Diagnosis not present

## 2021-03-10 DIAGNOSIS — Z3401 Encounter for supervision of normal first pregnancy, first trimester: Secondary | ICD-10-CM

## 2021-03-10 DIAGNOSIS — Z3402 Encounter for supervision of normal first pregnancy, second trimester: Secondary | ICD-10-CM | POA: Diagnosis not present

## 2021-03-10 DIAGNOSIS — O99212 Obesity complicating pregnancy, second trimester: Secondary | ICD-10-CM | POA: Insufficient documentation

## 2021-03-10 DIAGNOSIS — Z362 Encounter for other antenatal screening follow-up: Secondary | ICD-10-CM

## 2021-03-10 DIAGNOSIS — Z3689 Encounter for other specified antenatal screening: Secondary | ICD-10-CM

## 2021-03-10 DIAGNOSIS — Z3A20 20 weeks gestation of pregnancy: Secondary | ICD-10-CM | POA: Insufficient documentation

## 2021-03-13 ENCOUNTER — Other Ambulatory Visit (HOSPITAL_COMMUNITY)
Admission: RE | Admit: 2021-03-13 | Discharge: 2021-03-13 | Disposition: A | Discharge status: Home / Self Care | Payer: Medicaid Other | Source: Ambulatory Visit | Attending: Family Medicine | Admitting: Family Medicine

## 2021-03-13 ENCOUNTER — Ambulatory Visit (INDEPENDENT_AMBULATORY_CARE_PROVIDER_SITE_OTHER): Payer: Medicaid Other | Admitting: Family Medicine

## 2021-03-13 ENCOUNTER — Other Ambulatory Visit: Payer: Self-pay

## 2021-03-13 VITALS — BP 123/83 | HR 106 | Wt 194.5 lb

## 2021-03-13 DIAGNOSIS — N898 Other specified noninflammatory disorders of vagina: Secondary | ICD-10-CM

## 2021-03-13 DIAGNOSIS — Z3401 Encounter for supervision of normal first pregnancy, first trimester: Secondary | ICD-10-CM

## 2021-03-13 NOTE — Progress Notes (Signed)
° ° °  PRENATAL VISIT NOTE  Subjective:  Michelle Chen is a 21 y.o. G1P0000 at [redacted]w[redacted]d being seen today for ongoing prenatal care.  She is currently monitored for the following issues for this low-risk pregnancy and has Encounter for management and injection of depo-Provera and Supervision of low-risk first pregnancy on their problem list.  Patient reports no complaints.  Contractions: Not present. Vag. Bleeding: None.  Movement: Absent. Denies leaking of fluid.   The following portions of the patient's history were reviewed and updated as appropriate: allergies, current medications, past family history, past medical history, past social history, past surgical history and problem list.   Objective:   Vitals:   03/13/21 1012  BP: 123/83  Pulse: (!) 106  Weight: 194 lb 8 oz (88.2 kg)    Fetal Status: Fetal Heart Rate (bpm): 141   Movement: Absent     General:  Alert, oriented and cooperative. Patient is in no acute distress.  Skin: Skin is warm and dry. No rash noted.   Cardiovascular: Normal heart rate noted  Respiratory: Normal respiratory effort, no problems with respiration noted  Abdomen: Soft, gravid, appropriate for gestational age.  Pain/Pressure: Absent     Pelvic: Cervical exam deferred        Extremities: Normal range of motion.  Edema: None  Mental Status: Normal mood and affect. Normal behavior. Normal judgment and thought content.   Assessment and Plan:  Pregnancy: G1P0000 at [redacted]w[redacted]d 1. Encounter for supervision of low-risk first pregnancy in first trimester No questions today Doing well Reviewed cadence of visits  2. Vaginal odor - Cervicovaginal ancillary only( [place])  Preterm labor symptoms and general obstetric precautions including but not limited to vaginal bleeding, contractions, leaking of fluid and fetal movement were reviewed in detail with the patient. Please refer to After Visit Summary for other counseling recommendations.   Return in about 4  weeks (around 04/10/2021) for Routine prenatal care, Mom+Baby Combined Care, scheduled visit.  Future Appointments  Date Time Provider Department Center  04/08/2021  9:00 AM [facility] NURSE [facility]  04/08/2021  9:15 AM [facility] US2 [facility]  04/09/2021  2:35 PM MOMBABYDYAD [facility]  05/12/2021 11:15 AM Alvester Morin, Isa Rankin, MD [facility]    Federico Flake, MD

## 2021-03-14 ENCOUNTER — Encounter: Payer: Self-pay | Admitting: Family Medicine

## 2021-03-14 LAB — CERVICOVAGINAL ANCILLARY ONLY
Bacterial Vaginitis (gardnerella): NEGATIVE
Candida Glabrata: NEGATIVE
Candida Vaginitis: POSITIVE — AB
Chlamydia: NEGATIVE
Comment: NEGATIVE
Comment: NEGATIVE
Comment: NEGATIVE
Comment: NEGATIVE
Comment: NEGATIVE
Comment: NORMAL
Neisseria Gonorrhea: NEGATIVE
Trichomonas: NEGATIVE

## 2021-03-26 DIAGNOSIS — Z419 Encounter for procedure for purposes other than remedying health state, unspecified: Secondary | ICD-10-CM | POA: Diagnosis not present

## 2021-04-08 ENCOUNTER — Ambulatory Visit: Payer: Medicaid Other | Attending: Obstetrics and Gynecology

## 2021-04-08 ENCOUNTER — Other Ambulatory Visit: Payer: Self-pay

## 2021-04-08 ENCOUNTER — Encounter: Payer: Self-pay | Admitting: *Deleted

## 2021-04-08 ENCOUNTER — Ambulatory Visit: Payer: Medicaid Other | Admitting: *Deleted

## 2021-04-08 ENCOUNTER — Other Ambulatory Visit: Payer: Self-pay | Admitting: *Deleted

## 2021-04-08 VITALS — BP 107/65 | HR 83

## 2021-04-08 DIAGNOSIS — Z3402 Encounter for supervision of normal first pregnancy, second trimester: Secondary | ICD-10-CM | POA: Insufficient documentation

## 2021-04-08 DIAGNOSIS — O99212 Obesity complicating pregnancy, second trimester: Secondary | ICD-10-CM | POA: Insufficient documentation

## 2021-04-08 DIAGNOSIS — Z362 Encounter for other antenatal screening follow-up: Secondary | ICD-10-CM | POA: Insufficient documentation

## 2021-04-08 DIAGNOSIS — Z3A24 24 weeks gestation of pregnancy: Secondary | ICD-10-CM | POA: Insufficient documentation

## 2021-04-08 DIAGNOSIS — Z6830 Body mass index (BMI) 30.0-30.9, adult: Secondary | ICD-10-CM

## 2021-04-09 ENCOUNTER — Ambulatory Visit (INDEPENDENT_AMBULATORY_CARE_PROVIDER_SITE_OTHER): Payer: Medicaid Other | Admitting: Family Medicine

## 2021-04-09 VITALS — BP 118/77 | HR 114 | Wt 198.8 lb

## 2021-04-09 DIAGNOSIS — Z3402 Encounter for supervision of normal first pregnancy, second trimester: Secondary | ICD-10-CM

## 2021-04-09 NOTE — Patient Instructions (Signed)

## 2021-04-09 NOTE — Progress Notes (Signed)
? ?  Subjective:  ?Michelle Chen is a 21 y.o. G1P0000 at [redacted]w[redacted]d being seen today for ongoing prenatal care.  She is currently monitored for the following issues for this low-risk pregnancy and has Encounter for management and injection of depo-Provera and Supervision of low-risk first pregnancy on their problem list. ? ?Patient reports no complaints.  Contractions: Not present. Vag. Bleeding: None.  Movement: Present. Denies leaking of fluid.  ? ?The following portions of the patient's history were reviewed and updated as appropriate: allergies, current medications, past family history, past medical history, past social history, past surgical history and problem list. Problem list updated. ? ?Objective:  ? ?Vitals:  ? 04/09/21 1448  ?BP: 118/77  ?Pulse: (!) 114  ?Weight: 198 lb 12.8 oz (90.2 kg)  ? ? ?Fetal Status: Fetal Heart Rate (bpm): 152   Movement: Present    ? ?General:  Alert, oriented and cooperative. Patient is in no acute distress.  ?Skin: Skin is warm and dry. No rash noted.   ?Cardiovascular: Normal heart rate noted  ?Respiratory: Normal respiratory effort, no problems with respiration noted  ?Abdomen: Soft, gravid, appropriate for gestational age. Pain/Pressure: Absent     ?Pelvic: Vag. Bleeding: None     ?Cervical exam deferred        ?Extremities: Normal range of motion.  Edema: None  ?Mental Status: Normal mood and affect. Normal behavior. Normal judgment and thought content.  ? ?Urinalysis:     ? ?Assessment and Plan:  ?Pregnancy: G1P0000 at [redacted]w[redacted]d ? ?1. Encounter for supervision of low-risk first pregnancy in second trimester ?BP and FHR normal ?Anatomy scan was normal ?No concerns ? ?Preterm labor symptoms and general obstetric precautions including but not limited to vaginal bleeding, contractions, leaking of fluid and fetal movement were reviewed in detail with the patient. ?Please refer to After Visit Summary for other counseling recommendations.  ?Return in 4 weeks (on 05/07/2021) for Dyad  patient, ob visit. ? ? ?Venora Maples, MD ? ?

## 2021-04-26 DIAGNOSIS — Z419 Encounter for procedure for purposes other than remedying health state, unspecified: Secondary | ICD-10-CM | POA: Diagnosis not present

## 2021-04-28 ENCOUNTER — Other Ambulatory Visit: Payer: Self-pay

## 2021-04-28 DIAGNOSIS — Z3401 Encounter for supervision of normal first pregnancy, first trimester: Secondary | ICD-10-CM

## 2021-05-07 ENCOUNTER — Encounter: Payer: Self-pay | Admitting: Family Medicine

## 2021-05-07 ENCOUNTER — Other Ambulatory Visit: Payer: Medicaid Other

## 2021-05-07 ENCOUNTER — Ambulatory Visit (INDEPENDENT_AMBULATORY_CARE_PROVIDER_SITE_OTHER): Payer: Medicaid Other | Admitting: Family Medicine

## 2021-05-07 VITALS — BP 112/76 | HR 102 | Wt 203.1 lb

## 2021-05-07 DIAGNOSIS — Z23 Encounter for immunization: Secondary | ICD-10-CM

## 2021-05-07 DIAGNOSIS — Z3401 Encounter for supervision of normal first pregnancy, first trimester: Secondary | ICD-10-CM | POA: Diagnosis not present

## 2021-05-07 DIAGNOSIS — Z3402 Encounter for supervision of normal first pregnancy, second trimester: Secondary | ICD-10-CM

## 2021-05-07 NOTE — Progress Notes (Signed)
? ?  Subjective:  ?Michelle Chen is a 21 y.o. G1P0000 at [redacted]w[redacted]d being seen today for ongoing prenatal care.  She is currently monitored for the following issues for this low-risk pregnancy and has Encounter for management and injection of depo-Provera and Supervision of low-risk first pregnancy on their problem list. ? ?Patient reports no complaints.  Contractions: Not present. Vag. Bleeding: None.  Movement: Present. Denies leaking of fluid.  ? ?The following portions of the patient's history were reviewed and updated as appropriate: allergies, current medications, past family history, past medical history, past social history, past surgical history and problem list. Problem list updated. ? ?Objective:  ? ?Vitals:  ? 05/07/21 0829  ?BP: 112/76  ?Pulse: (!) 102  ?Weight: 203 lb 1.6 oz (92.1 kg)  ? ? ?Fetal Status: Fetal Heart Rate (bpm): 141   Movement: Present    ? ?General:  Alert, oriented and cooperative. Patient is in no acute distress.  ?Skin: Skin is warm and dry. No rash noted.   ?Cardiovascular: Normal heart rate noted  ?Respiratory: Normal respiratory effort, no problems with respiration noted  ?Abdomen: Soft, gravid, appropriate for gestational age. Pain/Pressure: Present     ?Pelvic: Vag. Bleeding: None     ?Cervical exam deferred        ?Extremities: Normal range of motion.  Edema: None  ?Mental Status: Normal mood and affect. Normal behavior. Normal judgment and thought content.  ? ?Urinalysis:     ? ?Assessment and Plan:  ?Pregnancy: G1P0000 at [redacted]w[redacted]d ? ?1. Encounter for supervision of low-risk first pregnancy in second trimester ?BP and FHR normal ?28 wk labs and tdap today ? ?Preterm labor symptoms and general obstetric precautions including but not limited to vaginal bleeding, contractions, leaking of fluid and fetal movement were reviewed in detail with the patient. ?Please refer to After Visit Summary for other counseling recommendations.  ?Return in 2 weeks (on 05/21/2021) for Dyad patient, ob  visit. ? ? ?Venora Maples, MD ? ?

## 2021-05-07 NOTE — Addendum Note (Signed)
Addended by: Denyce Robert E on: 05/07/2021 09:02 AM ? ? Modules accepted: Orders ? ?

## 2021-05-07 NOTE — Patient Instructions (Signed)

## 2021-05-08 ENCOUNTER — Telehealth: Payer: Self-pay

## 2021-05-08 ENCOUNTER — Encounter: Payer: Self-pay | Admitting: Family Medicine

## 2021-05-08 DIAGNOSIS — O24419 Gestational diabetes mellitus in pregnancy, unspecified control: Secondary | ICD-10-CM | POA: Insufficient documentation

## 2021-05-08 DIAGNOSIS — Z8632 Personal history of gestational diabetes: Secondary | ICD-10-CM | POA: Insufficient documentation

## 2021-05-08 DIAGNOSIS — O24410 Gestational diabetes mellitus in pregnancy, diet controlled: Secondary | ICD-10-CM

## 2021-05-08 LAB — HIV ANTIBODY (ROUTINE TESTING W REFLEX): HIV Screen 4th Generation wRfx: NONREACTIVE

## 2021-05-08 LAB — CBC
Hematocrit: 38.9 % (ref 34.0–46.6)
Hemoglobin: 12.9 g/dL (ref 11.1–15.9)
MCH: 31.8 pg (ref 26.6–33.0)
MCHC: 33.2 g/dL (ref 31.5–35.7)
MCV: 96 fL (ref 79–97)
Platelets: 284 10*3/uL (ref 150–450)
RBC: 4.06 x10E6/uL (ref 3.77–5.28)
RDW: 12.2 % (ref 11.7–15.4)
WBC: 11 10*3/uL — ABNORMAL HIGH (ref 3.4–10.8)

## 2021-05-08 LAB — GLUCOSE TOLERANCE, 2 HOURS W/ 1HR
Glucose, 1 hour: 191 mg/dL — ABNORMAL HIGH (ref 70–179)
Glucose, 2 hour: 153 mg/dL — ABNORMAL HIGH (ref 70–152)
Glucose, Fasting: 75 mg/dL (ref 70–91)

## 2021-05-08 LAB — RPR: RPR Ser Ql: NONREACTIVE

## 2021-05-08 MED ORDER — ACCU-CHEK GUIDE VI STRP: ORAL_STRIP | 12 refills | Status: DC

## 2021-05-08 MED ORDER — ACCU-CHEK SOFTCLIX LANCETS MISC: 12 refills | Status: DC

## 2021-05-08 MED ORDER — ACCU-CHEK GUIDE W/DEVICE KIT
1.0000 | PACK | 0 refills | Status: DC | PRN
Start: 2021-05-08 — End: 2021-07-20

## 2021-05-08 NOTE — Telephone Encounter (Addendum)
-----   Message from Venora Maples, MD sent at 05/08/2021 12:25 PM EDT ----- ?28 wk labs show GDM, otherwise normal ?Clinical pool please place referral to DM educator and send testing supplies ?Admin pool please schedule next available with DM educator ? ?Notified pt results and that we have scheduled her diabetes education on 05/20/21 and a nurse visit for [place] to show her how to use the glucometer on 05/13/21.  I advised to please bring her supplies to the appt on 05/13/21 so that we can use her device.  Pt verbalized understanding.  ? Addison Naegeli, RN  ?05/08/21 ?

## 2021-05-09 ENCOUNTER — Encounter: Payer: Self-pay | Admitting: Family Medicine

## 2021-05-09 ENCOUNTER — Telehealth: Payer: Self-pay | Admitting: Family Medicine

## 2021-05-09 NOTE — Telephone Encounter (Signed)
Patient called in stating she can not afford the out of pocket cost for the device she needs, she was told to give [place] a call back if this was to happen so she is requesting to speak with a nurse about it.  ?

## 2021-05-12 ENCOUNTER — Encounter: Payer: Medicaid Other | Admitting: Family Medicine

## 2021-05-13 ENCOUNTER — Ambulatory Visit (INDEPENDENT_AMBULATORY_CARE_PROVIDER_SITE_OTHER): Payer: Medicaid Other

## 2021-05-13 VITALS — BP 125/80 | HR 101 | Wt 206.3 lb

## 2021-05-13 DIAGNOSIS — Z7189 Other specified counseling: Secondary | ICD-10-CM

## 2021-05-13 NOTE — Progress Notes (Signed)
Here today for glucometer teaching. Pt supplies Accu-Chek Guide meter, strips, and lancets. Discussed blood glucose check schedule, goal ranges, and how to check BG. Pt demonstrated ability to check blood glucose including good hand hygiene. Pt downloaded MySugr application on phone to automatically record blood glucose from meter. Pt given blood glucose log as back up method for documentation. Pt encouraged to contact office with any questions. Will return on 05/20/21 for diabetes education visit.  ? Fleet Contras RN ?05/13/21 ?

## 2021-05-20 ENCOUNTER — Ambulatory Visit: Payer: Medicaid Other | Admitting: *Deleted

## 2021-05-20 ENCOUNTER — Other Ambulatory Visit: Payer: Self-pay | Admitting: *Deleted

## 2021-05-20 ENCOUNTER — Encounter: Payer: Self-pay | Admitting: *Deleted

## 2021-05-20 ENCOUNTER — Ambulatory Visit: Payer: Medicaid Other | Attending: Obstetrics

## 2021-05-20 ENCOUNTER — Ambulatory Visit: Payer: Medicaid Other | Attending: Obstetrics and Gynecology | Admitting: Obstetrics and Gynecology

## 2021-05-20 ENCOUNTER — Ambulatory Visit (INDEPENDENT_AMBULATORY_CARE_PROVIDER_SITE_OTHER): Payer: Medicaid Other | Admitting: Registered"

## 2021-05-20 ENCOUNTER — Encounter: Payer: Medicaid Other | Attending: Family Medicine | Admitting: Registered"

## 2021-05-20 VITALS — BP 109/62 | HR 80

## 2021-05-20 DIAGNOSIS — O321XX0 Maternal care for breech presentation, not applicable or unspecified: Secondary | ICD-10-CM | POA: Insufficient documentation

## 2021-05-20 DIAGNOSIS — O24410 Gestational diabetes mellitus in pregnancy, diet controlled: Secondary | ICD-10-CM | POA: Diagnosis not present

## 2021-05-20 DIAGNOSIS — Z3A00 Weeks of gestation of pregnancy not specified: Secondary | ICD-10-CM | POA: Insufficient documentation

## 2021-05-20 DIAGNOSIS — Z362 Encounter for other antenatal screening follow-up: Secondary | ICD-10-CM | POA: Insufficient documentation

## 2021-05-20 DIAGNOSIS — O99213 Obesity complicating pregnancy, third trimester: Secondary | ICD-10-CM | POA: Insufficient documentation

## 2021-05-20 DIAGNOSIS — O24419 Gestational diabetes mellitus in pregnancy, unspecified control: Secondary | ICD-10-CM

## 2021-05-20 DIAGNOSIS — Z3402 Encounter for supervision of normal first pregnancy, second trimester: Secondary | ICD-10-CM

## 2021-05-20 DIAGNOSIS — Z3A30 30 weeks gestation of pregnancy: Secondary | ICD-10-CM | POA: Insufficient documentation

## 2021-05-20 DIAGNOSIS — Z713 Dietary counseling and surveillance: Secondary | ICD-10-CM | POA: Diagnosis not present

## 2021-05-20 DIAGNOSIS — E669 Obesity, unspecified: Secondary | ICD-10-CM | POA: Diagnosis not present

## 2021-05-20 DIAGNOSIS — Z6830 Body mass index (BMI) 30.0-30.9, adult: Secondary | ICD-10-CM | POA: Insufficient documentation

## 2021-05-20 NOTE — Progress Notes (Signed)
Maternal-Fetal Medicine  ? ?Name: Michelle Chen ?DOB: 12/19/2000 ?MRN: [id number] ?Referring Provider: Merian Capron, MD ? ?I had the pleasure of seeing [name] [facility]. She is G1 P0 at 30w 4d gestation and has a new diagnosis of gestational diabetes. ?Patient had consultation with diabetic educator today.  She reports she has been checking her blood glucose for the last 1 week and both her fasting and postprandial levels are within normal range. ?Her blood pressure today at her office is 109/62 mmHg. ? ?Ultrasound ?Fetal growth is appropriate for gestational age.  Amniotic fluid is normal and good fetal activity seen.  Breech presentation. ? ?Gestational diabetes ?I explained the diagnosis of gestational diabetes.  I emphasized the importance of good blood glucose control to prevent adverse fetal or neonatal outcomes.  I discussed blood glucose normal values. I encouraged her to check her blood glucose regularly. ?Possible complications of gestational diabetes include fetal macrosomia, shoulder dystocia and birth injuries, stillbirth (in poorly controlled diabetes) and neonatal respiratory syndrome and other complications. ? ?In about 85% of cases, gestational diabetes is well controlled by diet alone.  Exercise reduces the need for insulin.  Medical treatment includes oral hypoglycemics or insulin. ? ?Timing of delivery: In well-controlled diabetes on diet, patient can be delivered at 36- or 40-weeks' gestation. Vaginal delivery is not contraindicated. ?Type 2 diabetes develops in up to 50% of women with GDM. I recommend postpartum screening with 75-g glucose load at 6 to 12 weeks after delivery. ? ?Recommendations ?-An appointment was made for her to return in 4 weeks for fetal growth assessment. ?-If patient requires metformin or insulin, we recommend weekly BPP from [redacted] weeks gestation till delivery. ? ?Thank you for consultation.  If you have any questions or concerns,  please contact me the [facility].  Consultation including face-to-face (more than 50%) counseling 20 minutes. ? ?

## 2021-05-20 NOTE — Progress Notes (Signed)
Patient was seen for Gestational Diabetes self-management on 05/20/21  ?Start time 0825 and End time 0920  ? ?Estimated due date: 07/25/21; [redacted]w[redacted]d ? ?Clinical: ?Medications: reviewed ?Medical History: reviewed ?Labs: OGTT 75/191(H)/153(H), A1c 5.2%  ? ?Dietary and Lifestyle History: ?Patient was provided SMBG instruction on 4/19. Pt misunderstood PPBG and has been checking BG every 2 hours. Pt random numbers through out the day appear her PPBG maybe ~50% out of range. ? ?Physical Activity: ADL, has 2 active jobs ?Stress: not assessed ?Sleep: not assessed ? ?24 hr Recall: not assessed ? ?NUTRITION INTERVENTION  ?Nutrition education (E-1) on the following topics:  ? ?Initial Follow-up ? ?[x]  []  Definition of Gestational Diabetes ?[x]  []  Why dietary management is important in controlling blood glucose ?[x]  []  Effects each nutrient has on blood glucose levels ?[x]  []  Simple carbohydrates vs complex carbohydrates ?[]  []  Fluid intake ?[]  []  Creating a balanced meal plan ?[x]  []  Carbohydrate counting  ?[x]  []  When to check blood glucose levels ?[x]  []  Proper blood glucose monitoring techniques ?[x]  []  Effect of stress and stress reduction techniques  ?[x]  []  Exercise effect on blood glucose levels, appropriate exercise during pregnancy ?[x]  []  Importance of limiting caffeine and abstaining from alcohol and smoking ?[x]  []  Medications used for blood sugar control during pregnancy ?[x]  []  Hypoglycemia and rule of 15 ?[x]  []  Postpartum self care ? ?Patient already has a meter, is testing pre breakfast and every 2 hours but not 2 hrs after meals ?FBS: 77-81 mg/dL ? ?Patient instructed to monitor glucose levels: ?FBS: 60 - ? 95 mg/dL (some clinics use 90 for cutoff) ?1 hour: ? 140 mg/dL ?2 hour: ? 120 mg/dL ? ?Patient received handouts: ?Nutrition Diabetes and Pregnancy ?Carbohydrate Counting List ? ?Patient will be seen for follow-up as needed.  ?

## 2021-05-23 ENCOUNTER — Encounter: Payer: Self-pay | Admitting: Family Medicine

## 2021-05-23 ENCOUNTER — Ambulatory Visit (INDEPENDENT_AMBULATORY_CARE_PROVIDER_SITE_OTHER): Payer: Medicaid Other | Admitting: Family Medicine

## 2021-05-23 VITALS — BP 112/59 | HR 111 | Wt 209.5 lb

## 2021-05-23 DIAGNOSIS — Z3402 Encounter for supervision of normal first pregnancy, second trimester: Secondary | ICD-10-CM

## 2021-05-23 DIAGNOSIS — O24410 Gestational diabetes mellitus in pregnancy, diet controlled: Secondary | ICD-10-CM

## 2021-05-23 NOTE — Progress Notes (Signed)
? ?  Subjective:  ?Michelle Chen is a 21 y.o. G1P0000 at [redacted]w[redacted]d being seen today for ongoing prenatal care.  She is currently monitored for the following issues for this low-risk pregnancy and has Encounter for management and injection of depo-Provera; Supervision of low-risk first pregnancy; and Gestational diabetes on their problem list. ? ?Patient reports no complaints.  Contractions: Not present. Vag. Bleeding: None.  Movement: Present. Denies leaking of fluid.  ? ?The following portions of the patient's history were reviewed and updated as appropriate: allergies, current medications, past family history, past medical history, past social history, past surgical history and problem list. Problem list updated. ? ?Objective:  ? ?Vitals:  ? 05/23/21 1016  ?BP: (!) 112/59  ?Pulse: (!) 111  ?Weight: 209 lb 8 oz (95 kg)  ? ? ?Fetal Status: Fetal Heart Rate (bpm): 144   Movement: Present    ? ?General:  Alert, oriented and cooperative. Patient is in no acute distress.  ?Skin: Skin is warm and dry. No rash noted.   ?Cardiovascular: Normal heart rate noted  ?Respiratory: Normal respiratory effort, no problems with respiration noted  ?Abdomen: Soft, gravid, appropriate for gestational age. Pain/Pressure: Absent     ?Pelvic: Vag. Bleeding: None     ?Cervical exam deferred        ?Extremities: Normal range of motion.     ?Mental Status: Normal mood and affect. Normal behavior. Normal judgment and thought content.  ? ?Urinalysis:     ? ?Assessment and Plan:  ?Pregnancy: G1P0000 at [redacted]w[redacted]d ? ?1. Encounter for supervision of low-risk first pregnancy in second trimester ?BP and FHR normal ?Worried about breech presentation on last [place], reassured her that this is incredibly common at this gestational age and there is a very high chance baby will be vertex and stay that way in the next few weeks ? ?2. Diet controlled gestational diabetes mellitus (GDM), antepartum ?Log reviewed, good control with diet ?Last growth 4/245/23, EFW  85% ?Following w MFM ? ?Preterm labor symptoms and general obstetric precautions including but not limited to vaginal bleeding, contractions, leaking of fluid and fetal movement were reviewed in detail with the patient. ?Please refer to After Visit Summary for other counseling recommendations.  ?Return in 2 weeks (on 06/06/2021) for Dyad patient, ob visit. ? ? ?Clarnce Flock, MD ? ?

## 2021-05-23 NOTE — Patient Instructions (Signed)

## 2021-05-26 DIAGNOSIS — Z419 Encounter for procedure for purposes other than remedying health state, unspecified: Secondary | ICD-10-CM | POA: Diagnosis not present

## 2021-06-05 ENCOUNTER — Ambulatory Visit (INDEPENDENT_AMBULATORY_CARE_PROVIDER_SITE_OTHER): Payer: Medicaid Other | Admitting: Family Medicine

## 2021-06-05 ENCOUNTER — Encounter: Payer: Self-pay | Admitting: Family Medicine

## 2021-06-05 VITALS — BP 111/61 | HR 104 | Wt 210.3 lb

## 2021-06-05 DIAGNOSIS — O0990 Supervision of high risk pregnancy, unspecified, unspecified trimester: Secondary | ICD-10-CM

## 2021-06-05 DIAGNOSIS — O24410 Gestational diabetes mellitus in pregnancy, diet controlled: Secondary | ICD-10-CM

## 2021-06-05 NOTE — Progress Notes (Signed)
? ? ?  PRENATAL VISIT NOTE ? ?Subjective:  ?Michelle Chen is a 21 y.o. G1P0000 at [redacted]w[redacted]d being seen today for ongoing prenatal care.  She is currently monitored for the following issues for this high-risk pregnancy and has Encounter for management and injection of depo-Provera; Supervision of high risk pregnancy, antepartum; and Gestational diabetes on their problem list. ? ?Patient reports no complaints.  Contractions: Not present. Vag. Bleeding: None.  Movement: Present. Denies leaking of fluid.  ? ?Fasting 77-99 , mostly in th 80s ?2hr --100- 151 , typically in the 110s. Reports pancakes with eggs and syrup ? ?Salads- 115s,  ?Noted triggers are sweets/carbs.  ? ?The following portions of the patient's history were reviewed and updated as appropriate: allergies, current medications, past family history, past medical history, past social history, past surgical history and problem list.  ? ?Objective:  ? ?Vitals:  ? 06/05/21 1548  ?BP: 111/61  ?Pulse: (!) 104  ?Weight: 210 lb 4.8 oz (95.4 kg)  ? ? ?Fetal Status: Fetal Heart Rate (bpm): 149   Movement: Present    ? ?General:  Alert, oriented and cooperative. Patient is in no acute distress.  ?Skin: Skin is warm and dry. No rash noted.   ?Cardiovascular: Normal heart rate noted  ?Respiratory: Normal respiratory effort, no problems with respiration noted  ?Abdomen: Soft, gravid, appropriate for gestational age.  Pain/Pressure: Present     ?Pelvic: Cervical exam deferred        ?Extremities: Normal range of motion.  Edema: Trace  ?Mental Status: Normal mood and affect. Normal behavior. Normal judgment and thought content.  ? ?Assessment and Plan:  ?Pregnancy: G1P0000 at [redacted]w[redacted]d ? ?1. Supervision of high risk pregnancy, antepartum ?Baby shower next week ?Doing well otherwise ?No concerns today ? ?2. Diet controlled gestational diabetes mellitus (GDM), antepartum ?Doing well ?Encouraged continued diet control and checking sugars  ?Discussed that she is able to eat larger  meals if she does lean proteins and vegetables ?Needs 36 wk [place]-- scheduled 5/22 ? ?Preterm labor symptoms and general obstetric precautions including but not limited to vaginal bleeding, contractions, leaking of fluid and fetal movement were reviewed in detail with the patient. ?Please refer to After Visit Summary for other counseling recommendations.  ? ?Return in about 12 days (around 06/17/2021) for Routine prenatal care, [facility], scheduled visit. ? ?Future Appointments  ?Date Time Provider Department Center  ?06/16/2021 12:45 PM [facility] NURSE [facility]  ?06/16/2021  1:00 PM [facility] US1 [facility]  ?06/17/2021  2:55 PM Crissie Reese, Mary Sella, MD [facility]  ?07/01/2021  2:55 PM Crissie Reese, Mary Sella, MD [facility]  ?07/10/2021 10:35 AM Federico Flake, MD [facility]  ?07/16/2021  9:35 AM Crissie Reese, Mary Sella, MD [facility]  ? ? ?Federico Flake, MD ? ?

## 2021-06-05 NOTE — Progress Notes (Signed)
Pt states Glucose log is in her phone. ?

## 2021-06-16 ENCOUNTER — Encounter: Payer: Self-pay | Admitting: *Deleted

## 2021-06-16 ENCOUNTER — Ambulatory Visit: Payer: Medicaid Other | Attending: Obstetrics and Gynecology

## 2021-06-16 ENCOUNTER — Ambulatory Visit: Payer: Medicaid Other | Admitting: *Deleted

## 2021-06-16 ENCOUNTER — Other Ambulatory Visit: Payer: Self-pay | Admitting: *Deleted

## 2021-06-16 VITALS — BP 113/58 | HR 94

## 2021-06-16 DIAGNOSIS — O24410 Gestational diabetes mellitus in pregnancy, diet controlled: Secondary | ICD-10-CM

## 2021-06-16 DIAGNOSIS — O99213 Obesity complicating pregnancy, third trimester: Secondary | ICD-10-CM | POA: Diagnosis not present

## 2021-06-16 DIAGNOSIS — O0990 Supervision of high risk pregnancy, unspecified, unspecified trimester: Secondary | ICD-10-CM | POA: Insufficient documentation

## 2021-06-16 DIAGNOSIS — Z3A34 34 weeks gestation of pregnancy: Secondary | ICD-10-CM

## 2021-06-16 DIAGNOSIS — E669 Obesity, unspecified: Secondary | ICD-10-CM | POA: Diagnosis not present

## 2021-06-16 DIAGNOSIS — Z362 Encounter for other antenatal screening follow-up: Secondary | ICD-10-CM

## 2021-06-16 DIAGNOSIS — Z6830 Body mass index (BMI) 30.0-30.9, adult: Secondary | ICD-10-CM | POA: Insufficient documentation

## 2021-06-17 ENCOUNTER — Encounter: Payer: Self-pay | Admitting: Family Medicine

## 2021-06-17 ENCOUNTER — Ambulatory Visit (INDEPENDENT_AMBULATORY_CARE_PROVIDER_SITE_OTHER): Payer: Medicaid Other | Admitting: Family Medicine

## 2021-06-17 VITALS — BP 113/76 | HR 96 | Wt 213.4 lb

## 2021-06-17 DIAGNOSIS — O24410 Gestational diabetes mellitus in pregnancy, diet controlled: Secondary | ICD-10-CM

## 2021-06-17 DIAGNOSIS — O0990 Supervision of high risk pregnancy, unspecified, unspecified trimester: Secondary | ICD-10-CM

## 2021-06-17 DIAGNOSIS — O321XX0 Maternal care for breech presentation, not applicable or unspecified: Secondary | ICD-10-CM

## 2021-06-17 NOTE — Progress Notes (Signed)
   Subjective:  Michelle Chen is a 21 y.o. G1P0000 at [redacted]w[redacted]d being seen today for ongoing prenatal care.  She is currently monitored for the following issues for this high-risk pregnancy and has Encounter for management and injection of depo-Provera; Supervision of high risk pregnancy, antepartum; Gestational diabetes; and Breech presentation on their problem list.  Patient reports no complaints.  Contractions: Irritability. Vag. Bleeding: None.  Movement: Present. Denies leaking of fluid.   The following portions of the patient's history were reviewed and updated as appropriate: allergies, current medications, past family history, past medical history, past social history, past surgical history and problem list. Problem list updated.  Objective:   Vitals:   06/17/21 1503  BP: 113/76  Pulse: 96  Weight: 213 lb 6.4 oz (96.8 kg)    Fetal Status: Fetal Heart Rate (bpm): 138   Movement: Present     General:  Alert, oriented and cooperative. Patient is in no acute distress.  Skin: Skin is warm and dry. No rash noted.   Cardiovascular: Normal heart rate noted  Respiratory: Normal respiratory effort, no problems with respiration noted  Abdomen: Soft, gravid, appropriate for gestational age. Pain/Pressure: Present     Pelvic: Vag. Bleeding: None     Cervical exam deferred        Extremities: Normal range of motion.     Mental Status: Normal mood and affect. Normal behavior. Normal judgment and thought content.   Urinalysis:      Assessment and Plan:  Pregnancy: G1P0000 at [redacted]w[redacted]d  1. Supervision of high risk pregnancy, antepartum BP and FHR normal  2. Diet controlled gestational diabetes mellitus (GDM), antepartum Log reviewed, consistent with dietary indiscretion, fastings are good and post prandials mostly at goal but discussed need to adhere more strictly to diet otherwise will need to start meds Last growth [place] 06/16/2021, EFW 88%, AFI 18, has follow up scheduled Plan for IOL at 39  weeks  3. Breech presentation, single or unspecified fetus Still early but discussed spinning babies with patient Follow up at next visit  Preterm labor symptoms and general obstetric precautions including but not limited to vaginal bleeding, contractions, leaking of fluid and fetal movement were reviewed in detail with the patient. Please refer to After Visit Summary for other counseling recommendations.  Return in 2 weeks (on 07/01/2021) for Dyad patient, ob visit.   Clarnce Flock, MD

## 2021-06-17 NOTE — Patient Instructions (Signed)

## 2021-06-26 DIAGNOSIS — Z419 Encounter for procedure for purposes other than remedying health state, unspecified: Secondary | ICD-10-CM | POA: Diagnosis not present

## 2021-07-01 ENCOUNTER — Encounter: Payer: Self-pay | Admitting: Family Medicine

## 2021-07-01 ENCOUNTER — Other Ambulatory Visit (HOSPITAL_COMMUNITY)
Admission: RE | Admit: 2021-07-01 | Discharge: 2021-07-01 | Disposition: A | Discharge status: Home / Self Care | Payer: Medicaid Other | Source: Ambulatory Visit | Attending: Family Medicine | Admitting: Family Medicine

## 2021-07-01 ENCOUNTER — Ambulatory Visit (INDEPENDENT_AMBULATORY_CARE_PROVIDER_SITE_OTHER): Payer: Medicaid Other | Admitting: Family Medicine

## 2021-07-01 VITALS — BP 107/70 | HR 94 | Wt 216.1 lb

## 2021-07-01 DIAGNOSIS — O321XX0 Maternal care for breech presentation, not applicable or unspecified: Secondary | ICD-10-CM

## 2021-07-01 DIAGNOSIS — O0990 Supervision of high risk pregnancy, unspecified, unspecified trimester: Secondary | ICD-10-CM

## 2021-07-01 DIAGNOSIS — Z3A36 36 weeks gestation of pregnancy: Secondary | ICD-10-CM

## 2021-07-01 DIAGNOSIS — O24410 Gestational diabetes mellitus in pregnancy, diet controlled: Secondary | ICD-10-CM

## 2021-07-01 NOTE — Progress Notes (Signed)
   Subjective:  Michelle Chen is a 21 y.o. G1P0000 at [redacted]w[redacted]d being seen today for ongoing prenatal care.  She is currently monitored for the following issues for this high-risk pregnancy and has Encounter for management and injection of depo-Provera; Supervision of high risk pregnancy, antepartum; Gestational diabetes; and Breech presentation on their problem list.  Patient reports no complaints.  Contractions: Irritability. Vag. Bleeding: None.  Movement: Present. Denies leaking of fluid.   The following portions of the patient's history were reviewed and updated as appropriate: allergies, current medications, past family history, past medical history, past social history, past surgical history and problem list. Problem list updated.  Objective:   Vitals:   07/01/21 1508  BP: 107/70  Pulse: 94  Weight: 216 lb 1.6 oz (98 kg)    Fetal Status: Fetal Heart Rate (bpm): 140   Movement: Present     General:  Alert, oriented and cooperative. Patient is in no acute distress.  Skin: Skin is warm and dry. No rash noted.   Cardiovascular: Normal heart rate noted  Respiratory: Normal respiratory effort, no problems with respiration noted  Abdomen: Soft, gravid, appropriate for gestational age. Pain/Pressure: Present     Pelvic: Vag. Bleeding: None     Cervical exam performed        Extremities: Normal range of motion.     Mental Status: Normal mood and affect. Normal behavior. Normal judgment and thought content.   Urinalysis:      Assessment and Plan:  Pregnancy: G1P0000 at [redacted]w[redacted]d  1. Supervision of high risk pregnancy, antepartum BP and FHR normal Swabs today Discussed contraception, still undecided - Culture, beta strep (group b only) - GC/Chlamydia probe amp ([place])not at [facility]  2. Diet controlled gestational diabetes mellitus (GDM), antepartum Log reviewed, good control on diet Last growth [place] 06/16/2021, AFI 18, EFW 88%, EFW 2838g Has follow up on 07/14/2021  3. Breech  presentation, single or unspecified fetus On exam today cervix closed and unable to palpate fetal parts Bedside US shows breech presentation Discussed with patient options of ECV vs primary cesarean Agrees to trial of ECV, discussed risk is primarily fetal intolerance leading to emergency cesarean Scheduled for next Monday 07/07/2021 at 0900 Also encouraged her to continue with spinning babies exercises  Preterm labor symptoms and general obstetric precautions including but not limited to vaginal bleeding, contractions, leaking of fluid and fetal movement were reviewed in detail with the patient. Please refer to After Visit Summary for other counseling recommendations.  Return in 1 week (on 07/08/2021) for Dyad patient, ob visit.   Clarnce Flock, MD

## 2021-07-01 NOTE — Patient Instructions (Signed)

## 2021-07-02 ENCOUNTER — Encounter (HOSPITAL_COMMUNITY): Payer: Self-pay

## 2021-07-02 ENCOUNTER — Other Ambulatory Visit: Payer: Self-pay | Admitting: Advanced Practice Midwife

## 2021-07-02 ENCOUNTER — Telehealth (HOSPITAL_COMMUNITY): Payer: Self-pay | Admitting: *Deleted

## 2021-07-02 LAB — GC/CHLAMYDIA PROBE AMP (~~LOC~~) NOT AT ARMC
Chlamydia: NEGATIVE
Comment: NEGATIVE
Comment: NORMAL
Neisseria Gonorrhea: NEGATIVE

## 2021-07-02 NOTE — Telephone Encounter (Signed)
Preadmission screen  

## 2021-07-03 ENCOUNTER — Telehealth (HOSPITAL_COMMUNITY): Payer: Self-pay | Admitting: *Deleted

## 2021-07-03 NOTE — Telephone Encounter (Signed)
Preadmission screen  

## 2021-07-04 ENCOUNTER — Telehealth (HOSPITAL_COMMUNITY): Payer: Self-pay | Admitting: *Deleted

## 2021-07-04 ENCOUNTER — Encounter (HOSPITAL_COMMUNITY): Payer: Self-pay | Admitting: *Deleted

## 2021-07-04 NOTE — Telephone Encounter (Signed)
Preadmission screen  

## 2021-07-05 LAB — CULTURE, BETA STREP (GROUP B ONLY): Strep Gp B Culture: POSITIVE — AB

## 2021-07-07 ENCOUNTER — Ambulatory Visit (HOSPITAL_COMMUNITY)
Admission: AD | Admit: 2021-07-07 | Discharge: 2021-07-07 | Disposition: A | Discharge status: Home / Self Care | Payer: Medicaid Other | Attending: Family Medicine | Admitting: Family Medicine

## 2021-07-07 ENCOUNTER — Other Ambulatory Visit: Payer: Self-pay

## 2021-07-07 ENCOUNTER — Inpatient Hospital Stay (HOSPITAL_COMMUNITY): Payer: Medicaid Other | Admitting: Anesthesiology

## 2021-07-07 ENCOUNTER — Encounter (HOSPITAL_COMMUNITY): Payer: Self-pay | Admitting: Family Medicine

## 2021-07-07 ENCOUNTER — Encounter: Payer: Self-pay | Admitting: Family Medicine

## 2021-07-07 ENCOUNTER — Observation Stay (HOSPITAL_COMMUNITY)
Admission: RE | Admit: 2021-07-07 | Discharge: 2021-07-07 | Disposition: A | Discharge status: Home / Self Care | Payer: Medicaid Other | Source: Ambulatory Visit | Attending: Family Medicine | Admitting: Family Medicine

## 2021-07-07 DIAGNOSIS — O99333 Smoking (tobacco) complicating pregnancy, third trimester: Secondary | ICD-10-CM | POA: Insufficient documentation

## 2021-07-07 DIAGNOSIS — O321XX0 Maternal care for breech presentation, not applicable or unspecified: Secondary | ICD-10-CM | POA: Diagnosis not present

## 2021-07-07 DIAGNOSIS — O24410 Gestational diabetes mellitus in pregnancy, diet controlled: Secondary | ICD-10-CM | POA: Insufficient documentation

## 2021-07-07 DIAGNOSIS — O99820 Streptococcus B carrier state complicating pregnancy: Secondary | ICD-10-CM | POA: Insufficient documentation

## 2021-07-07 DIAGNOSIS — Z3A37 37 weeks gestation of pregnancy: Secondary | ICD-10-CM | POA: Diagnosis not present

## 2021-07-07 DIAGNOSIS — O24429 Gestational diabetes mellitus in childbirth, unspecified control: Secondary | ICD-10-CM | POA: Diagnosis not present

## 2021-07-07 DIAGNOSIS — Z3A00 Weeks of gestation of pregnancy not specified: Secondary | ICD-10-CM | POA: Diagnosis not present

## 2021-07-07 LAB — CBC
HCT: 40.2 % (ref 36.0–46.0)
Hemoglobin: 13.7 g/dL (ref 12.0–15.0)
MCH: 32.6 pg (ref 26.0–34.0)
MCHC: 34.1 g/dL (ref 30.0–36.0)
MCV: 95.7 fL (ref 80.0–100.0)
Platelets: 268 10*3/uL (ref 150–400)
RBC: 4.2 MIL/uL (ref 3.87–5.11)
RDW: 12.8 % (ref 11.5–15.5)
WBC: 9.1 10*3/uL (ref 4.0–10.5)
nRBC: 0 % (ref 0.0–0.2)

## 2021-07-07 LAB — TYPE AND SCREEN
ABO/RH(D): B POS
Antibody Screen: NEGATIVE

## 2021-07-07 LAB — ABO/RH: ABO/RH(D): B POS

## 2021-07-07 LAB — RPR: RPR Ser Ql: NONREACTIVE

## 2021-07-07 LAB — GLUCOSE, CAPILLARY: Glucose-Capillary: 89 mg/dL (ref 70–99)

## 2021-07-07 SURGERY — Surgical Case
Anesthesia: Regional

## 2021-07-07 MED ORDER — TERBUTALINE SULFATE 1 MG/ML IJ SOLN
INTRAMUSCULAR | Status: AC
  Filled 2021-07-07: qty 1

## 2021-07-07 MED ORDER — ONDANSETRON HCL 4 MG/2ML IJ SOLN
INTRAMUSCULAR | Status: AC
  Filled 2021-07-07: qty 2

## 2021-07-07 MED ORDER — BUPIVACAINE HCL (PF) 0.25 % IJ SOLN
INTRAMUSCULAR | Status: DC | PRN
  Administered 2021-07-07: 1 mL via INTRATHECAL

## 2021-07-07 MED ORDER — EPHEDRINE 5 MG/ML INJ: 10.0000 mg | INTRAVENOUS | Status: DC | PRN

## 2021-07-07 MED ORDER — PHENYLEPHRINE 80 MCG/ML (10ML) SYRINGE FOR IV PUSH (FOR BLOOD PRESSURE SUPPORT)
PREFILLED_SYRINGE | INTRAVENOUS | Status: AC
  Administered 2021-07-07: 80 ug via INTRAVENOUS
  Filled 2021-07-07: qty 10

## 2021-07-07 MED ORDER — FENTANYL CITRATE (PF) 100 MCG/2ML IJ SOLN
INTRAMUSCULAR | Status: AC
  Filled 2021-07-07: qty 2

## 2021-07-07 MED ORDER — LACTATED RINGERS IV SOLN: INTRAVENOUS | Status: DC

## 2021-07-07 MED ORDER — PHENYLEPHRINE 80 MCG/ML (10ML) SYRINGE FOR IV PUSH (FOR BLOOD PRESSURE SUPPORT): 80.0000 ug | PREFILLED_SYRINGE | INTRAVENOUS | Status: DC | PRN

## 2021-07-07 MED ORDER — LACTATED RINGERS IV SOLN: 500.0000 mL | Freq: Once | INTRAVENOUS | Status: DC

## 2021-07-07 MED ORDER — ONDANSETRON HCL 4 MG/2ML IJ SOLN
4.0000 mg | Freq: Once | INTRAMUSCULAR | Status: AC
Start: 2021-07-07 — End: 2021-07-07
  Administered 2021-07-07: 4 mg via INTRAVENOUS

## 2021-07-07 MED ORDER — DIPHENHYDRAMINE HCL 50 MG/ML IJ SOLN: 12.5000 mg | INTRAMUSCULAR | Status: DC | PRN

## 2021-07-07 MED ORDER — FENTANYL CITRATE (PF) 100 MCG/2ML IJ SOLN
INTRAMUSCULAR | Status: DC | PRN
  Administered 2021-07-07: 15 ug via INTRATHECAL

## 2021-07-07 MED ORDER — TERBUTALINE SULFATE 1 MG/ML IJ SOLN
0.2500 mg | Freq: Once | INTRAMUSCULAR | Status: AC
  Administered 2021-07-07: 0.25 mg via SUBCUTANEOUS

## 2021-07-07 MED ORDER — SODIUM CHLORIDE 0.9% FLUSH: 3.0000 mL | Freq: Two times a day (BID) | INTRAVENOUS | Status: DC

## 2021-07-07 MED ORDER — FENTANYL-BUPIVACAINE-NACL 0.5-0.125-0.9 MG/250ML-% EP SOLN: 12.0000 mL/h | EPIDURAL | Status: DC | PRN

## 2021-07-07 NOTE — Anesthesia Preprocedure Evaluation (Addendum)
Anesthesia Evaluation  Patient identified by MRN, date of birth, ID band Patient awake    Reviewed: Allergy & Precautions, H&P , NPO status , Patient's Chart, lab work & pertinent test results  History of Anesthesia Complications Negative for: history of anesthetic complications  Airway Mallampati: II  TM Distance: >3 FB     Dental   Pulmonary neg pulmonary ROS,    Pulmonary exam normal        Cardiovascular negative cardio ROS   Rhythm:regular Rate:Normal     Neuro/Psych negative neurological ROS  negative psych ROS   GI/Hepatic negative GI ROS, Neg liver ROS,   Endo/Other  diabetes, Gestational  Renal/GU negative Renal ROS  negative genitourinary   Musculoskeletal   Abdominal   Peds  Hematology negative hematology ROS (+)   Anesthesia Other Findings Presenting for ECV for breech presentation  Reproductive/Obstetrics (+) Pregnancy (Breech presentation)                             Anesthesia Physical Anesthesia Plan  ASA: 2  Anesthesia Plan: Combined Spinal and Epidural   Post-op Pain Management:    Induction:   PONV Risk Score and Plan:   Airway Management Planned:   Additional Equipment:   Intra-op Plan:   Post-operative Plan:   Informed Consent: I have reviewed the patients History and Physical, chart, labs and discussed the procedure including the risks, benefits and alternatives for the proposed anesthesia with the patient or authorized representative who has indicated his/her understanding and acceptance.       Plan Discussed with:   Anesthesia Plan Comments:         Anesthesia Quick Evaluation

## 2021-07-07 NOTE — Addendum Note (Signed)
Addendum  created 07/07/21 1241 by Lidia Collum, MD   Attestation recorded in [place], [place] filed

## 2021-07-07 NOTE — H&P (Signed)
LABOR AND DELIVERY ADMISSION HISTORY AND PHYSICAL NOTE  Michelle Chen is a 21 y.o. female G1P0000 with IUP at 49w3dby LMP presenting for external cephalic version.   Patient seen in office six days ago, found to be breech on bedside [place] counseled and accepted trial of ECV today.   She reports positive fetal movement. She denies leakage of fluid, vaginal bleeding, or contractions.   She plans on breast and bottle feeding. Her contraception plan is: Nexplanon.  Pren[facility] History/Complications[facility] [place]  @[redacted]w[redacted]d , CWD, normal anatomy, breech presentation, anterior placenta, 88%ile, EFW 2838g, AFI 18  Pregnancy complications:  - GDM, diet controlled  Past Medical History: Past Medical History:  Diagnosis Date   Gestational diabetes    Medical history non-contributory    Vaginal Pap smear, abnormal     Past Surgical History: Past Surgical History:  Procedure Laterality Date   MOUTH SURGERY     NO PAST SURGERIES      Obstetrical History: OB History     Gravida  1   Para  0   Term  0   Preterm  0   AB  0   Living  0      SAB  0   IAB  0   Ectopic  0   Multiple  0   Live Births  0           Social History: Social History   Socioeconomic History   Marital status: Single    Spouse name: Not on file   Number of children: Not on file   Years of education: Not on file   Highest education level: Not on file  Occupational History   Not on file  Tobacco Use   Smoking status: Never    Passive exposure: Past   Smokeless tobacco: Never   Tobacco comments:    gma smokes in and out of the home   Vaping Use   Vaping Use: Never used  Substance and Sexual Activity   Alcohol use: No    Alcohol/week: 0.0 standard drinks of alcohol   Drug use: No   Sexual activity: Yes    Birth control/protection: None  Other Topics Concern   Not on file  Social History Narrative   BShizuelives with her maternal grandparents and  siblings. Mother is local and has contact with the family.       Social Determinants of Health   Financial Resource Strain: Not on file  Food Insecurity: Not on file  Transportation Needs: Not on file  Physical Activity: Not on file  Stress: Not on file  Social Connections: Not on file    Family History: Family History  Problem Relation Age of Onset   Learning disabilities Sister    Asthma Sister    Asthma Brother    Asthma Maternal Grandmother    Alzheimer's disease Maternal Grandfather     Allergies: No Known Allergies  Medications Prior to Admission  Medication Sig Dispense Refill Last Dose   Prenatal 27-1 MG TABS Take 1 tablet by mouth daily. 30 tablet 0 07/06/2021   Accu-Chek Softclix Lancets lancets Use as instructed 100 each 12    Blood Glucose Monitoring Suppl (ACCU-CHEK GUIDE) w/Device KIT 1 Device by Does not apply route as needed. 1 kit 0    Blood Pressure Monitoring (BLOOD PRESSURE KIT) DEVI 1 Device by Does not apply route daily. 1 each 0    glucose blood (ACCU-CHEK GUIDE) test strip Use  as instructed 100 each 12      Review of Systems  All systems reviewed and negative except as stated in HPI  Physical Exam Blood pressure 122/85, pulse 84, temperature 98.2 F (36.8 C), temperature source Oral, resp. rate 16, height 5' 3"  (1.6 m), weight 93.4 kg, last menstrual period 10/22/2020, SpO2 100 %. General appearance: alert, oriented, NAD Lungs: normal respiratory effort Heart: regular rate Abdomen: soft, non-tender; gravid Extremities: No calf swelling or tenderness Presentation: breech on bedside US  Fetal monitoringBaseline: 140 bpm, Variability: Good {> 6 bpm), Accelerations: Reactive, and Decelerations: Absent Uterine activity: irregular contractions with uterine irritability     Prenatal labs: ABO, Rh: --/--/PENDING (06/12 3662) Antibody: PENDING (06/12 0915) Rubella: 6.26 (12/20 0916) RPR: Non Reactive (04/12 0835)  HBsAg: Negative (12/20 0916)   HIV: Non Reactive (04/12 0835)  GC/Chlamydia:  Neisseria Gonorrhea  Date Value Ref Range Status  07/01/2021 Negative  Final   Chlamydia  Date Value Ref Range Status  07/01/2021 Negative  Final   GBS: Positive/-- (06/06 1556)  2-hr GTT: abnormal Genetic screening:  low risk Anatomy US: normal  Prenatal Transfer Tool  Maternal Diabetes: Yes:  Diabetes Type:  Diet controlled Genetic Screening: Normal Maternal Ultrasounds/Referrals: Normal Fetal Ultrasounds or other Referrals:  None Maternal Substance Abuse:  No Significant Maternal Medications:  None Significant Maternal Lab Results: Group B Strep positive  Results for orders placed or performed during the hospital encounter of 07/07/21 (from the past 24 hour(s))  Type and screen   Collection Time: 07/07/21  9:15 AM  Result Value Ref Range   ABO/RH(D) PENDING    Antibody Screen PENDING    Sample Expiration      07/10/2021,2359 Performed at [facility], [address] [place], [place] [address]   CBC   Collection Time: 07/07/21  9:20 AM  Result Value Ref Range   WBC 9.1 4.0 - 10.5 K/uL   RBC 4.20 3.87 - 5.11 MIL/uL   Hemoglobin 13.7 12.0 - 15.0 g/dL   HCT 40.2 36.0 - 46.0 %   MCV 95.7 80.0 - 100.0 fL   MCH 32.6 26.0 - 34.0 pg   MCHC 34.1 30.0 - 36.0 g/dL   RDW 12.8 11.5 - 15.5 %   Platelets 268 150 - 400 K/uL   nRBC 0.0 0.0 - 0.2 %  Glucose, capillary   Collection Time: 07/07/21  9:38 AM  Result Value Ref Range   Glucose-Capillary 89 70 - 99 mg/dL  ABO/Rh   Collection Time: 07/07/21  9:43 AM  Result Value Ref Range   ABO/RH(D) PENDING     Patient Active Problem List   Diagnosis Date Noted   Breech presentation 06/17/2021   Gestational diabetes 05/08/2021   Supervision of high risk pregnancy, antepartum 12/31/2020   Encounter for management and injection of depo-Provera 11/19/2014    Assessment: Michelle Chen is a 21 y.o. G1P0000 at 68w3dhere for scheduled ECV due to persistent breech  presentation.  #Breech presentation: Risks of ECV discussed with patient including pain, bleeding, and fetal intolerance of procedure necessitating emergent cesarean delivery.  The risks of cesarean section discussed with the patient included but were not limited to: bleeding which may require transfusion or reoperation; infection which may require antibiotics; injury to bowel, bladder, ureters or other surrounding organs; injury to the fetus; need for additional procedures including hysterectomy in the event of a life-threatening hemorrhage; placental abnormalities with subsequent pregnancies, incisional problems, thromboembolic phenomenon and other postoperative/anesthesia complications.   The  patient concurred with the proposed plan, giving informed written consent for the procedure. Patient has been NPO since last night she will remain NPO for procedure. Anesthesia and OR aware.   Will plan to place CSE and give terbutaline SQ to facilitate procedure.     #Pain: plan for CSE for procedure #FWB: Cat I tracing #GBS/ID: positive #MOF: Both #MOC: IP Nexplanon #Circ: Yes #GDM: pending outcome of ECV  Clarnce Flock 07/07/2021, 10:16 AM

## 2021-07-07 NOTE — Anesthesia Postprocedure Evaluation (Addendum)
Anesthesia Post Note  Patient: Michelle Chen  Procedure(s) Performed: VERSION        Patient location during evaluation: PACU Anesthesia Type: Combined Spinal/Epidural Level of consciousness: awake and alert Pain management: pain level controlled Vital Signs Assessment: post-procedure vital signs reviewed and stable Respiratory status: spontaneous breathing, nonlabored ventilation and respiratory function stable Cardiovascular status: blood pressure returned to baseline and stable Postop Assessment: no apparent nausea or vomiting, no headache, no backache, spinal receding and able to ambulate Anesthetic complications: no   No notable events documented.  Last Vitals: There were no vitals filed for this visit.  Last Pain: There were no vitals filed for this visit. Pain Goal:                   Lucretia Kern

## 2021-07-07 NOTE — Procedures (Signed)
ECV Procedure Note  After informed verbal and written consent, as well as placement of CSE Terbutaline 0.25 mg SQ given, ECV was attempted under Ultrasound guidance. FHR was reactive prior to attempting procedure.    On initial presentation to PACU infant had been back up with head in RUQ. Unfortunately when we began the procedure infant had flipped to back down with head still in RUQ. There was a minimal pocket of fluid in the lower uterus. I attempted and was quite easily able to lift the breech out of the pelvis. However after two attempts I was not able to get infant to rotate. After two attempts lasting about 3-4 minutes I did an [place] check of the heart rate and saw the infant's heart rate was in the 90's. I placed the [place] monitor and it had risen to the 100's and subsequently was normal after observing for about a minute. I discussed with the patient that at this point I was willing to try one more time, but I would recommend that we abandon the procedure. She was in agreement with abandoning the procedure.   FHR was monitored after the procedure and was Cat I and reactive.   The patient tolerated the procedure well.  Venora Maples, MD/MPH Attending Family Medicine Physician, [facility], [facility]

## 2021-07-07 NOTE — Anesthesia Procedure Notes (Addendum)
Epidural Patient location during procedure: OB Start time: 07/07/2021 10:40 AM End time: 07/07/2021 10:44 AM  Staffing Anesthesiologist: Lucretia Kern, MD Performed: anesthesiologist   Preanesthetic Checklist Completed: patient identified, IV checked, risks and benefits discussed, monitors and equipment checked, pre-op evaluation and timeout performed  Epidural Patient position: sitting Prep: DuraPrep Patient monitoring: heart rate, continuous pulse ox and blood pressure Approach: midline Location: L3-L4 Injection technique: LOR air  Needle:  Needle type: Tuohy  Needle gauge: 17 G Needle length: 9 cm Needle insertion depth: 7 cm Catheter type: closed end flexible Catheter size: 19 Gauge Catheter at skin depth: 12 cm Test dose: negative  Assessment Events: blood not aspirated, injection not painful, no injection resistance, no paresthesia and negative IV test  Additional Notes The patient was prepped and draped in the usual sterile fashion. A combined spinal-epidural was performed using a 9 cm 17g Tuohy needle and loss of resistance technique. After encountering LOR, a 12 cm 24g spinal needle was introduced via the Tuohy and clear CSF was aspirated prior to injection of fentanyl/local anesthetic. The spinal needle was removed and a 19 g flexible epidural catheter placed prior to Tuohy removal. Patient tolerated the procedure well without complications.Reason for block:procedure for pain

## 2021-07-10 ENCOUNTER — Ambulatory Visit (INDEPENDENT_AMBULATORY_CARE_PROVIDER_SITE_OTHER): Payer: Medicaid Other | Admitting: Family Medicine

## 2021-07-10 ENCOUNTER — Encounter: Payer: Self-pay | Admitting: Family Medicine

## 2021-07-10 ENCOUNTER — Encounter (HOSPITAL_COMMUNITY): Payer: Self-pay

## 2021-07-10 VITALS — BP 108/71 | HR 90 | Wt 216.4 lb

## 2021-07-10 DIAGNOSIS — O24410 Gestational diabetes mellitus in pregnancy, diet controlled: Secondary | ICD-10-CM

## 2021-07-10 DIAGNOSIS — O99820 Streptococcus B carrier state complicating pregnancy: Secondary | ICD-10-CM

## 2021-07-10 DIAGNOSIS — O321XX0 Maternal care for breech presentation, not applicable or unspecified: Secondary | ICD-10-CM

## 2021-07-10 DIAGNOSIS — O0990 Supervision of high risk pregnancy, unspecified, unspecified trimester: Secondary | ICD-10-CM

## 2021-07-10 DIAGNOSIS — Z3A37 37 weeks gestation of pregnancy: Secondary | ICD-10-CM

## 2021-07-10 NOTE — Progress Notes (Signed)
   PRENATAL VISIT NOTE  Subjective:  Michelle Chen is a 21 y.o. G1P0000 at [redacted]w[redacted]d being seen today for ongoing prenatal care.  She is currently monitored for the following issues for this high-risk pregnancy and has Encounter for management and injection of depo-Provera; Supervision of high risk pregnancy, antepartum; Gestational diabetes; Breech presentation; and GBS (group B Streptococcus carrier), +RV culture, currently pregnant on their problem list.  Patient reports no complaints.  Contractions: Irritability. Vag. Bleeding: None.  Movement: Present. Denies leaking of fluid.   The following portions of the patient's history were reviewed and updated as appropriate: allergies, current medications, past family history, past medical history, past social history, past surgical history and problem list.   Objective:   Vitals:   07/10/21 1106  BP: 108/71  Pulse: 90  Weight: 216 lb 6.4 oz (98.2 kg)    Fetal Status: Fetal Heart Rate (bpm): 154   Movement: Present     General:  Alert, oriented and cooperative. Patient is in no acute distress.  Skin: Skin is warm and dry. No rash noted.   Cardiovascular: Normal heart rate noted  Respiratory: Normal respiratory effort, no problems with respiration noted  Abdomen: Soft, gravid, appropriate for gestational age.  Pain/Pressure: Present     Pelvic: Cervical exam deferred        Extremities: Normal range of motion.     Mental Status: Normal mood and affect. Normal behavior. Normal judgment and thought content.   Assessment and Plan:  Pregnancy: G1P0000 at [redacted]w[redacted]d 1. Supervision of high risk pregnancy, antepartum Up to date Doing well after ECV Confirmed continued breech position today by [place]  2. GBS (group B Streptococcus carrier), +RV culture, currently pregnant PCN in labor  3. Breech presentation, single or unspecified fetus Failed ECV on 6/12 Persistent breech today Has repeat ECV /IOL scheduled on 6/21 -- possible CS if not VTX or  if ECV again not successful Consider regional anesthesia placement to help with ECV  4. Diet controlled gestational diabetes mellitus (GDM), antepartum Fastings are 80-90s, all lower than 95 2hr PP are 80-140s, has about 1 PP per day that is > 140, knows triggers IOL vs CS at 39 weeks (Estimated Date of Delivery: 07/25/21)  Preterm labor symptoms and general obstetric precautions including but not limited to vaginal bleeding, contractions, leaking of fluid and fetal movement were reviewed in detail with the patient. Please refer to After Visit Summary for other counseling recommendations.   Return in about 1 week (around 07/17/2021) for Routine prenatal care.  Future Appointments  Date Time Provider Department Center  07/14/2021  1:30 PM [facility]URSE [facility]  07/14/2021  1:45 PM [facility] US5 [facility]  07/16/2021  9:35 AM Venora Maples, MD [facility]  07/16/2021 10:30 AM [facility] PAT 1 [facility] None    Federico Flake, MD

## 2021-07-10 NOTE — Patient Instructions (Signed)
Michelle Chen  07/10/2021   Your procedure is scheduled on:  07/18/2021  Arrive at 1030 at Entrance C on [employer] at [facility]  and [employer]. You are invited to use the FREE valet parking or use the Visitor's parking deck.  Pick up the phone at the desk and dial [phone].  Call this number if you have problems the morning of surgery: [phone]  Remember:   Do not eat food:(After Midnight) Desps de medianoche.  Do not drink clear liquids: (After Midnight) Desps de medianoche.  Take these medicines the morning of surgery with A SIP OF WATER:  none   Do not wear jewelry, make-up or nail polish.  Do not wear lotions, powders, or perfumes. Do not wear deodorant.  Do not shave 48 hours prior to surgery.  Do not bring valuables to the hospital.  [facility] is not   responsible for any belongings or valuables brought to the hospital.  Contacts, dentures or bridgework may not be worn into surgery.  Leave suitcase in the car. After surgery it may be brought to your room.  For patients admitted to the hospital, checkout time is 11:00 AM the day of              discharge.      Please read over the following fact sheets that you were given:     Preparing for Surgery

## 2021-07-14 ENCOUNTER — Ambulatory Visit: Payer: Medicaid Other | Attending: Obstetrics

## 2021-07-14 ENCOUNTER — Ambulatory Visit: Payer: Medicaid Other | Admitting: *Deleted

## 2021-07-14 ENCOUNTER — Encounter: Payer: Self-pay | Admitting: *Deleted

## 2021-07-14 VITALS — BP 125/77 | HR 92

## 2021-07-14 DIAGNOSIS — O24410 Gestational diabetes mellitus in pregnancy, diet controlled: Secondary | ICD-10-CM | POA: Diagnosis not present

## 2021-07-14 DIAGNOSIS — E669 Obesity, unspecified: Secondary | ICD-10-CM | POA: Diagnosis not present

## 2021-07-14 DIAGNOSIS — Z3A38 38 weeks gestation of pregnancy: Secondary | ICD-10-CM | POA: Diagnosis not present

## 2021-07-14 DIAGNOSIS — O0990 Supervision of high risk pregnancy, unspecified, unspecified trimester: Secondary | ICD-10-CM | POA: Insufficient documentation

## 2021-07-14 DIAGNOSIS — O99213 Obesity complicating pregnancy, third trimester: Secondary | ICD-10-CM | POA: Diagnosis not present

## 2021-07-16 ENCOUNTER — Other Ambulatory Visit (HOSPITAL_COMMUNITY)
Admission: RE | Admit: 2021-07-16 | Discharge: 2021-07-16 | Disposition: A | Discharge status: Home / Self Care | Payer: Medicaid Other | Source: Ambulatory Visit | Attending: Family Medicine | Admitting: Family Medicine

## 2021-07-16 ENCOUNTER — Encounter: Payer: Self-pay | Admitting: Family Medicine

## 2021-07-16 ENCOUNTER — Ambulatory Visit (INDEPENDENT_AMBULATORY_CARE_PROVIDER_SITE_OTHER): Payer: Medicaid Other | Admitting: Family Medicine

## 2021-07-16 ENCOUNTER — Other Ambulatory Visit: Payer: Self-pay

## 2021-07-16 VITALS — BP 94/58 | HR 107 | Wt 217.1 lb

## 2021-07-16 DIAGNOSIS — O24410 Gestational diabetes mellitus in pregnancy, diet controlled: Secondary | ICD-10-CM

## 2021-07-16 DIAGNOSIS — Z3A38 38 weeks gestation of pregnancy: Secondary | ICD-10-CM

## 2021-07-16 DIAGNOSIS — Z3A00 Weeks of gestation of pregnancy not specified: Secondary | ICD-10-CM | POA: Diagnosis not present

## 2021-07-16 DIAGNOSIS — O321XX0 Maternal care for breech presentation, not applicable or unspecified: Secondary | ICD-10-CM | POA: Insufficient documentation

## 2021-07-16 DIAGNOSIS — O99820 Streptococcus B carrier state complicating pregnancy: Secondary | ICD-10-CM

## 2021-07-16 DIAGNOSIS — O0990 Supervision of high risk pregnancy, unspecified, unspecified trimester: Secondary | ICD-10-CM

## 2021-07-16 HISTORY — DX: Other complications of anesthesia, initial encounter: T88.59XA

## 2021-07-16 LAB — CBC
HCT: 38.7 % (ref 36.0–46.0)
Hemoglobin: 13.3 g/dL (ref 12.0–15.0)
MCH: 32.6 pg (ref 26.0–34.0)
MCHC: 34.4 g/dL (ref 30.0–36.0)
MCV: 94.9 fL (ref 80.0–100.0)
Platelets: 248 10*3/uL (ref 150–400)
RBC: 4.08 MIL/uL (ref 3.87–5.11)
RDW: 13 % (ref 11.5–15.5)
WBC: 8 10*3/uL (ref 4.0–10.5)
nRBC: 0 % (ref 0.0–0.2)

## 2021-07-16 LAB — TYPE AND SCREEN
ABO/RH(D): B POS
Antibody Screen: NEGATIVE

## 2021-07-16 NOTE — Patient Instructions (Signed)

## 2021-07-16 NOTE — Progress Notes (Signed)
   Subjective:  Michelle Chen is a 21 y.o. G1P0000 at [redacted]w[redacted]d being seen today for ongoing prenatal care.  She is currently monitored for the following issues for this high-risk pregnancy and has Encounter for management and injection of depo-Provera; Supervision of high risk pregnancy, antepartum; Gestational diabetes; Breech presentation; and GBS (group B Streptococcus carrier), +RV culture, currently pregnant on their problem list.  Patient reports no complaints.  Contractions: Irritability. Vag. Bleeding: None.  Movement: Present. Denies leaking of fluid.   The following portions of the patient's history were reviewed and updated as appropriate: allergies, current medications, past family history, past medical history, past social history, past surgical history and problem list. Problem list updated.  Objective:   Vitals:   07/16/21 1005  BP: (!) 94/58  Pulse: (!) 107  Weight: 217 lb 1.6 oz (98.5 kg)    Fetal Status: Fetal Heart Rate (bpm): 137   Movement: Present     General:  Alert, oriented and cooperative. Patient is in no acute distress.  Skin: Skin is warm and dry. No rash noted.   Cardiovascular: Normal heart rate noted  Respiratory: Normal respiratory effort, no problems with respiration noted  Abdomen: Soft, gravid, appropriate for gestational age. Pain/Pressure: Present     Pelvic: Vag. Bleeding: None     Cervical exam deferred        Extremities: Normal range of motion.     Mental Status: Normal mood and affect. Normal behavior. Normal judgment and thought content.   Urinalysis:      Assessment and Plan:  Pregnancy: G1P0000 at [redacted]w[redacted]d  1. Supervision of high risk pregnancy, antepartum BP and FHR normal  2. Diet controlled gestational diabetes mellitus (GDM), antepartum Log reviewed, moderately well controlled Delivery in two days already scheduled  3. GBS (group B Streptococcus carrier), +RV culture, currently pregnant Ppx at time of cesarean  4. Breech  presentation, single or unspecified fetus Persistent since around 34 weeks Last growth [place] two days prior shows macrosomia, EFW 97% 4107g Scheduled for cesarean in two days  Term labor symptoms and general obstetric precautions including but not limited to vaginal bleeding, contractions, leaking of fluid and fetal movement were reviewed in detail with the patient. Please refer to After Visit Summary for other counseling recommendations.  Return in 7 weeks (on 09/03/2021) for [facility] patient, PP check.   Venora Maples, MD

## 2021-07-17 ENCOUNTER — Encounter (HOSPITAL_COMMUNITY): Payer: Self-pay | Admitting: Family Medicine

## 2021-07-17 LAB — RPR: RPR Ser Ql: NONREACTIVE

## 2021-07-17 NOTE — H&P (Shared)
***UPDATE DATE/TIME  OBSTETRIC ADMISSION HISTORY AND PHYSICAL  Michelle Chen is a 21 y.o. female G1P0000 with IUP at [redacted]w[redacted]d by LMP presenting for scheduled cesarean section due to malpresentation. She reports +FMs, no LOF, no VB, no blurry vision, headaches, peripheral edema, or RUQ pain.  She plans on breast and formula feeding. She requests Nexplanon for birth control postpartum.  She received her prenatal care at [facility]   Dating: By LMP --->  Estimated Date of Delivery: 07/25/21  Sono:   @[redacted]w[redacted]d , CWD, normal anatomy, anterior placental lie, 4107 g, 97% EFW  Prenatal History/Complications:  A1GDM  Malpresentation (breech)  Past Medical History: Past Medical History:  Diagnosis Date   Complication of anesthesia    itching after epidural with version   Gestational diabetes    Medical history non-contributory    Vaginal Pap smear, abnormal     Past Surgical History: Past Surgical History:  Procedure Laterality Date   MOUTH SURGERY     NO PAST SURGERIES      Obstetrical History: OB History     Gravida  1   Para  0   Term  0   Preterm  0   AB  0   Living  0      SAB  0   IAB  0   Ectopic  0   Multiple  0   Live Births  0           Social History Social History   Socioeconomic History   Marital status: Single    Spouse name: Not on file   Number of children: Not on file   Years of education: Not on file   Highest education level: Not on file  Occupational History   Not on file  Tobacco Use   Smoking status: Never    Passive exposure: Past   Smokeless tobacco: Never   Tobacco comments:    gma smokes in and out of the home   Vaping Use   Vaping Use: Never used  Substance and Sexual Activity   Alcohol use: No    Alcohol/week: 0.0 standard drinks of alcohol   Drug use: No   Sexual activity: Yes    Birth control/protection: None  Other Topics Concern   Not on file  Social History Narrative   Michelle Chen lives with  her maternal grandparents and siblings. Mother is local and has contact with the family.       Social Determinants of Health   Financial Resource Strain: Not on file  Food Insecurity: Not on file  Transportation Needs: Not on file  Physical Activity: Not on file  Stress: Not on file  Social Connections: Not on file    Family History: Family History  Problem Relation Age of Onset   Learning disabilities Sister    Asthma Sister    Asthma Brother    Asthma Maternal Grandmother    Alzheimer's disease Maternal Grandfather     Allergies: No Known Allergies  No medications prior to admission.   Review of Systems  All systems reviewed and negative except as stated in HPI  Last menstrual period 10/22/2020.  General appearance: alert, cooperative, and no distress Lungs: normal work of breathing on room air  Heart: normal rate, warm and well perfused  Abdomen: soft, non-tender, gravid  Extremities: no LE edema or calf tenderness to palpation   Presentation: {desc; fetal presentation:14558}  Prenatal labs: ABO, Rh: --/--/B POS (06/21 1102) Antibody: NEG (06/21  1102) Rubella: 6.26 (12/20 0916) RPR: NON REACTIVE (06/21 1103)  HBsAg: Negative (12/20 0916)  HIV: Non Reactive (04/12 0835)  GBS: Positive/-- (06/06 1556)  2 hr Glucola abnormal  Genetic screening - LR NIPS, Horizon negative, AFP negative  Anatomy US normal   Prenatal Transfer Tool  Maternal Diabetes: Yes:  Diabetes Type:  Diet controlled Genetic Screening: Normal Maternal Ultrasounds/Referrals: Normal Fetal Ultrasounds or other Referrals:  None Maternal Substance Abuse:  No Significant Maternal Medications:  None Significant Maternal Lab Results: Group B Strep positive  No results found for this or any previous visit (from the past 24 hour(s)).  Patient Active Problem List   Diagnosis Date Noted   GBS (group B Streptococcus carrier), +RV culture, currently pregnant 07/07/2021   Breech presentation  06/17/2021   Gestational diabetes 05/08/2021   Supervision of high risk pregnancy, antepartum 12/31/2020   Encounter for management and injection of depo-Provera 11/19/2014    Assessment/Plan:  Jennye Runquist is a 21 y.o. G1P0000 at [redacted]w[redacted]d here for scheduled primary cesarean section due to breech presentation.   The risks of surgery were discussed with the patient including but were not limited to: bleeding which may require transfusion or reoperation; infection which may require antibiotics; injury to bowel, bladder, ureters or other surrounding organs; injury to the fetus; need for additional procedures including hysterectomy in the event of a life-threatening hemorrhage; formation of adhesions; placental abnormalities with subsequent pregnancies; incisional problems; thromboembolic phenomenon and other postoperative/anesthesia complications.  The patient concurred with the proposed plan, giving informed written consent for the procedure.   Patient has been NPO since *** she will remain NPO for procedure. Anesthesia and OR aware. Preoperative prophylactic antibiotics and SCDs ordered on call to the OR.  To OR when ready.  #Pain: Per anesthesia  #ID:  GBS positive. Ancef ordered for surgical prophylaxis  #MOF: Breast and formula  #MOC: Nexplanon inpatient  #Circ:  Desires   #GDM: Diet-controlled. CBG ***. Plan to check GTT at 6 week postpartum visit.   Worthy Rancher, MD  07/17/2021, 6:46 PM

## 2021-07-18 ENCOUNTER — Other Ambulatory Visit: Payer: Self-pay

## 2021-07-18 ENCOUNTER — Inpatient Hospital Stay (HOSPITAL_COMMUNITY): Payer: Medicaid Other | Admitting: Anesthesiology

## 2021-07-18 ENCOUNTER — Inpatient Hospital Stay (HOSPITAL_COMMUNITY)
Admission: RE | Admit: 2021-07-18 | Discharge: 2021-07-20 | DRG: 788 | Disposition: A | Discharge status: Home / Self Care | Payer: Medicaid Other | Attending: Family Medicine | Admitting: Family Medicine

## 2021-07-18 ENCOUNTER — Encounter (HOSPITAL_COMMUNITY): Payer: Self-pay | Admitting: Family Medicine

## 2021-07-18 ENCOUNTER — Encounter (HOSPITAL_COMMUNITY)
Admission: RE | Disposition: A | Discharge status: Home / Self Care | Payer: Self-pay | Source: Home / Self Care | Attending: Family Medicine

## 2021-07-18 DIAGNOSIS — O0990 Supervision of high risk pregnancy, unspecified, unspecified trimester: Secondary | ICD-10-CM

## 2021-07-18 DIAGNOSIS — O321XX0 Maternal care for breech presentation, not applicable or unspecified: Principal | ICD-10-CM | POA: Diagnosis present

## 2021-07-18 DIAGNOSIS — O99824 Streptococcus B carrier state complicating childbirth: Secondary | ICD-10-CM | POA: Diagnosis not present

## 2021-07-18 DIAGNOSIS — Z98891 History of uterine scar from previous surgery: Secondary | ICD-10-CM

## 2021-07-18 DIAGNOSIS — O24429 Gestational diabetes mellitus in childbirth, unspecified control: Secondary | ICD-10-CM

## 2021-07-18 DIAGNOSIS — O24419 Gestational diabetes mellitus in pregnancy, unspecified control: Secondary | ICD-10-CM | POA: Diagnosis present

## 2021-07-18 DIAGNOSIS — O24420 Gestational diabetes mellitus in childbirth, diet controlled: Secondary | ICD-10-CM | POA: Diagnosis present

## 2021-07-18 DIAGNOSIS — Z3A38 38 weeks gestation of pregnancy: Secondary | ICD-10-CM

## 2021-07-18 DIAGNOSIS — Z3A39 39 weeks gestation of pregnancy: Secondary | ICD-10-CM | POA: Diagnosis not present

## 2021-07-18 DIAGNOSIS — O99214 Obesity complicating childbirth: Secondary | ICD-10-CM | POA: Diagnosis not present

## 2021-07-18 DIAGNOSIS — O99820 Streptococcus B carrier state complicating pregnancy: Secondary | ICD-10-CM

## 2021-07-18 DIAGNOSIS — Z30017 Encounter for initial prescription of implantable subdermal contraceptive: Secondary | ICD-10-CM

## 2021-07-18 DIAGNOSIS — Z8632 Personal history of gestational diabetes: Secondary | ICD-10-CM | POA: Diagnosis present

## 2021-07-18 LAB — GLUCOSE, CAPILLARY
Glucose-Capillary: 78 mg/dL (ref 70–99)
Glucose-Capillary: 65 mg/dL — ABNORMAL LOW (ref 70–99)
Glucose-Capillary: 77 mg/dL (ref 70–99)

## 2021-07-18 SURGERY — Surgical Case
Anesthesia: Spinal

## 2021-07-18 MED ORDER — SCOPOLAMINE 1 MG/3DAYS TD PT72
MEDICATED_PATCH | TRANSDERMAL | Status: DC | PRN
  Administered 2021-07-18: 1 via TRANSDERMAL

## 2021-07-18 MED ORDER — MENTHOL 3 MG MT LOZG: 1.0000 | LOZENGE | OROMUCOSAL | Status: DC | PRN

## 2021-07-18 MED ORDER — IBUPROFEN 600 MG PO TABS
600.0000 mg | ORAL_TABLET | Freq: Four times a day (QID) | ORAL | Status: DC
  Administered 2021-07-19 – 2021-07-20 (×4): 600 mg via ORAL
  Filled 2021-07-18 (×4): qty 1

## 2021-07-18 MED ORDER — WITCH HAZEL-GLYCERIN EX PADS: 1.0000 | MEDICATED_PAD | CUTANEOUS | Status: DC | PRN

## 2021-07-18 MED ORDER — ONDANSETRON HCL 4 MG/2ML IJ SOLN: 4.0000 mg | Freq: Three times a day (TID) | INTRAMUSCULAR | Status: DC | PRN

## 2021-07-18 MED ORDER — BUPIVACAINE IN DEXTROSE 0.75-8.25 % IT SOLN
INTRATHECAL | Status: DC | PRN
  Administered 2021-07-18: 1.6 mL via INTRATHECAL

## 2021-07-18 MED ORDER — DIPHENHYDRAMINE HCL 50 MG/ML IJ SOLN: 12.5000 mg | INTRAMUSCULAR | Status: DC | PRN

## 2021-07-18 MED ORDER — PHENYLEPHRINE HCL (PRESSORS) 10 MG/ML IV SOLN
INTRAVENOUS | Status: DC | PRN
  Administered 2021-07-18 (×2): 160 ug via INTRAVENOUS

## 2021-07-18 MED ORDER — MEPERIDINE HCL 25 MG/ML IJ SOLN: 6.2500 mg | INTRAMUSCULAR | Status: DC | PRN

## 2021-07-18 MED ORDER — SIMETHICONE 80 MG PO CHEW
80.0000 mg | CHEWABLE_TABLET | Freq: Three times a day (TID) | ORAL | Status: DC
  Administered 2021-07-19 – 2021-07-20 (×5): 80 mg via ORAL
  Filled 2021-07-18 (×5): qty 1

## 2021-07-18 MED ORDER — OXYTOCIN-SODIUM CHLORIDE 30-0.9 UT/500ML-% IV SOLN
INTRAVENOUS | Status: DC | PRN
  Administered 2021-07-18: 300 mL via INTRAVENOUS

## 2021-07-18 MED ORDER — LACTATED RINGERS IV SOLN
INTRAVENOUS | Status: DC
Start: 2021-07-18 — End: 2021-07-20

## 2021-07-18 MED ORDER — PHENYLEPHRINE HCL-NACL 20-0.9 MG/250ML-% IV SOLN
INTRAVENOUS | Status: DC | PRN
  Administered 2021-07-18: 60 ug/min via INTRAVENOUS

## 2021-07-18 MED ORDER — MORPHINE SULFATE (PF) 0.5 MG/ML IJ SOLN
INTRAMUSCULAR | Status: DC | PRN
  Administered 2021-07-18: 150 ug via INTRATHECAL

## 2021-07-18 MED ORDER — KETOROLAC TROMETHAMINE 30 MG/ML IJ SOLN
INTRAMUSCULAR | Status: AC
  Filled 2021-07-18: qty 1

## 2021-07-18 MED ORDER — SIMETHICONE 80 MG PO CHEW: 80.0000 mg | CHEWABLE_TABLET | ORAL | Status: DC | PRN

## 2021-07-18 MED ORDER — ACETAMINOPHEN 500 MG PO TABS: 1000.0000 mg | ORAL_TABLET | Freq: Four times a day (QID) | ORAL | Status: DC

## 2021-07-18 MED ORDER — OXYCODONE HCL 5 MG PO TABS: 5.0000 mg | ORAL_TABLET | ORAL | Status: DC | PRN

## 2021-07-18 MED ORDER — ACETAMINOPHEN 10 MG/ML IV SOLN
INTRAVENOUS | Status: AC
  Filled 2021-07-18: qty 100

## 2021-07-18 MED ORDER — KETOROLAC TROMETHAMINE 30 MG/ML IJ SOLN: 30.0000 mg | Freq: Once | INTRAMUSCULAR | Status: DC | PRN

## 2021-07-18 MED ORDER — FENTANYL CITRATE (PF) 100 MCG/2ML IJ SOLN
INTRAMUSCULAR | Status: AC
  Filled 2021-07-18: qty 2

## 2021-07-18 MED ORDER — SODIUM CHLORIDE 0.9 % IR SOLN
Status: DC | PRN
  Administered 2021-07-18: 1

## 2021-07-18 MED ORDER — DIPHENHYDRAMINE HCL 25 MG PO CAPS: 25.0000 mg | ORAL_CAPSULE | Freq: Four times a day (QID) | ORAL | Status: DC | PRN

## 2021-07-18 MED ORDER — SCOPOLAMINE 1 MG/3DAYS TD PT72: 1.0000 | MEDICATED_PATCH | Freq: Once | TRANSDERMAL | Status: DC

## 2021-07-18 MED ORDER — ONDANSETRON HCL 4 MG/2ML IJ SOLN
INTRAMUSCULAR | Status: DC | PRN
  Administered 2021-07-18: 4 mg via INTRAVENOUS

## 2021-07-18 MED ORDER — ONDANSETRON HCL 4 MG/2ML IJ SOLN: 4.0000 mg | Freq: Once | INTRAMUSCULAR | Status: DC | PRN

## 2021-07-18 MED ORDER — KETOROLAC TROMETHAMINE 30 MG/ML IJ SOLN: 30.0000 mg | Freq: Four times a day (QID) | INTRAMUSCULAR | Status: DC | PRN

## 2021-07-18 MED ORDER — NALOXONE HCL 4 MG/10ML IJ SOLN: 1.0000 ug/kg/h | INTRAVENOUS | Status: DC | PRN

## 2021-07-18 MED ORDER — NALOXONE HCL 0.4 MG/ML IJ SOLN: 0.4000 mg | INTRAMUSCULAR | Status: DC | PRN

## 2021-07-18 MED ORDER — MORPHINE SULFATE (PF) 0.5 MG/ML IJ SOLN
INTRAMUSCULAR | Status: AC
  Filled 2021-07-18: qty 10

## 2021-07-18 MED ORDER — PHENYLEPHRINE 80 MCG/ML (10ML) SYRINGE FOR IV PUSH (FOR BLOOD PRESSURE SUPPORT)
PREFILLED_SYRINGE | INTRAVENOUS | Status: AC
  Filled 2021-07-18: qty 10

## 2021-07-18 MED ORDER — METOCLOPRAMIDE HCL 5 MG/ML IJ SOLN
INTRAMUSCULAR | Status: DC | PRN
  Administered 2021-07-18: 5 mg via INTRAVENOUS

## 2021-07-18 MED ORDER — OXYCODONE HCL 5 MG PO TABS: 5.0000 mg | ORAL_TABLET | Freq: Once | ORAL | Status: DC | PRN

## 2021-07-18 MED ORDER — COCONUT OIL OIL: 1.0000 | TOPICAL_OIL | Status: DC | PRN

## 2021-07-18 MED ORDER — CEFAZOLIN SODIUM-DEXTROSE 2-4 GM/100ML-% IV SOLN
INTRAVENOUS | Status: AC
  Filled 2021-07-18: qty 100

## 2021-07-18 MED ORDER — CEFAZOLIN SODIUM-DEXTROSE 2-4 GM/100ML-% IV SOLN
2.0000 g | INTRAVENOUS | Status: AC
  Administered 2021-07-18: 2 g via INTRAVENOUS

## 2021-07-18 MED ORDER — FENTANYL CITRATE (PF) 100 MCG/2ML IJ SOLN
INTRAMUSCULAR | Status: DC | PRN
Start: 2021-07-18 — End: 2021-07-18
  Administered 2021-07-18: 15 ug via INTRATHECAL

## 2021-07-18 MED ORDER — DEXAMETHASONE SODIUM PHOSPHATE 4 MG/ML IJ SOLN
INTRAMUSCULAR | Status: DC | PRN
  Administered 2021-07-18: 8 mg via INTRAVENOUS

## 2021-07-18 MED ORDER — DEXAMETHASONE SODIUM PHOSPHATE 4 MG/ML IJ SOLN
INTRAMUSCULAR | Status: AC
  Filled 2021-07-18: qty 2

## 2021-07-18 MED ORDER — OXYTOCIN-SODIUM CHLORIDE 30-0.9 UT/500ML-% IV SOLN: 2.5000 [IU]/h | INTRAVENOUS | Status: AC

## 2021-07-18 MED ORDER — POVIDONE-IODINE 10 % EX SWAB
2.0000 | Freq: Once | CUTANEOUS | Status: AC
  Administered 2021-07-18: 2 via TOPICAL

## 2021-07-18 MED ORDER — SOD CITRATE-CITRIC ACID 500-334 MG/5ML PO SOLN
ORAL | Status: AC
  Filled 2021-07-18: qty 30

## 2021-07-18 MED ORDER — ACETAMINOPHEN 500 MG PO TABS
1000.0000 mg | ORAL_TABLET | Freq: Four times a day (QID) | ORAL | Status: DC
  Administered 2021-07-18 – 2021-07-20 (×7): 1000 mg via ORAL
  Filled 2021-07-18 (×7): qty 2

## 2021-07-18 MED ORDER — ONDANSETRON HCL 4 MG/2ML IJ SOLN
INTRAMUSCULAR | Status: AC
  Filled 2021-07-18: qty 2

## 2021-07-18 MED ORDER — OXYCODONE HCL 5 MG/5ML PO SOLN: 5.0000 mg | Freq: Once | ORAL | Status: DC | PRN

## 2021-07-18 MED ORDER — DIBUCAINE (PERIANAL) 1 % EX OINT: 1.0000 | TOPICAL_OINTMENT | CUTANEOUS | Status: DC | PRN

## 2021-07-18 MED ORDER — ACETAMINOPHEN 10 MG/ML IV SOLN
INTRAVENOUS | Status: DC | PRN
  Administered 2021-07-18: 1000 mg via INTRAVENOUS

## 2021-07-18 MED ORDER — SODIUM CHLORIDE 0.9% FLUSH: 3.0000 mL | INTRAVENOUS | Status: DC | PRN

## 2021-07-18 MED ORDER — SENNOSIDES-DOCUSATE SODIUM 8.6-50 MG PO TABS
2.0000 | ORAL_TABLET | Freq: Every day | ORAL | Status: DC
  Administered 2021-07-19 – 2021-07-20 (×2): 2 via ORAL
  Filled 2021-07-18 (×2): qty 2

## 2021-07-18 MED ORDER — ENOXAPARIN SODIUM 40 MG/0.4ML IJ SOSY
40.0000 mg | PREFILLED_SYRINGE | INTRAMUSCULAR | Status: DC
  Administered 2021-07-19 – 2021-07-20 (×2): 40 mg via SUBCUTANEOUS
  Filled 2021-07-18 (×2): qty 0.4

## 2021-07-18 MED ORDER — METOCLOPRAMIDE HCL 5 MG/ML IJ SOLN
INTRAMUSCULAR | Status: AC
  Filled 2021-07-18: qty 2

## 2021-07-18 MED ORDER — KETOROLAC TROMETHAMINE 30 MG/ML IJ SOLN
30.0000 mg | Freq: Four times a day (QID) | INTRAMUSCULAR | Status: AC
  Administered 2021-07-18 – 2021-07-19 (×3): 30 mg via INTRAVENOUS
  Filled 2021-07-18 (×3): qty 1

## 2021-07-18 MED ORDER — HYDROMORPHONE HCL 1 MG/ML IJ SOLN
INTRAMUSCULAR | Status: AC
  Filled 2021-07-18: qty 0.5

## 2021-07-18 MED ORDER — PHENYLEPHRINE HCL-NACL 20-0.9 MG/250ML-% IV SOLN
INTRAVENOUS | Status: AC
  Filled 2021-07-18: qty 250

## 2021-07-18 MED ORDER — SOD CITRATE-CITRIC ACID 500-334 MG/5ML PO SOLN
30.0000 mL | ORAL | Status: AC
  Administered 2021-07-18: 30 mL via ORAL

## 2021-07-18 MED ORDER — LACTATED RINGERS IV SOLN: INTRAVENOUS | Status: DC

## 2021-07-18 MED ORDER — DIPHENHYDRAMINE HCL 25 MG PO CAPS: 25.0000 mg | ORAL_CAPSULE | ORAL | Status: DC | PRN

## 2021-07-18 MED ORDER — PRENATAL MULTIVITAMIN CH
1.0000 | ORAL_TABLET | Freq: Every day | ORAL | Status: DC
  Administered 2021-07-19 – 2021-07-20 (×2): 1 via ORAL
  Filled 2021-07-18 (×2): qty 1

## 2021-07-18 MED ORDER — AMISULPRIDE (ANTIEMETIC) 5 MG/2ML IV SOLN
10.0000 mg | Freq: Once | INTRAVENOUS | Status: DC | PRN
Start: 2021-07-18 — End: 2021-07-18
  Filled 2021-07-18: qty 4

## 2021-07-18 MED ORDER — OXYTOCIN-SODIUM CHLORIDE 30-0.9 UT/500ML-% IV SOLN
INTRAVENOUS | Status: AC
  Filled 2021-07-18: qty 500

## 2021-07-18 MED ORDER — KETOROLAC TROMETHAMINE 30 MG/ML IJ SOLN
INTRAMUSCULAR | Status: DC | PRN
  Administered 2021-07-18: 30 mg via INTRAVENOUS

## 2021-07-18 MED ORDER — HYDROMORPHONE HCL 1 MG/ML IJ SOLN: 0.2500 mg | INTRAMUSCULAR | Status: DC | PRN

## 2021-07-18 MED ORDER — STERILE WATER FOR IRRIGATION IR SOLN
Status: DC | PRN
  Administered 2021-07-18: 1000 mL

## 2021-07-18 SURGICAL SUPPLY — 39 items
APL SKNCLS STERI-STRIP NONHPOA (GAUZE/BANDAGES/DRESSINGS) ×1
BENZOIN TINCTURE PRP APPL 2/3 (GAUZE/BANDAGES/DRESSINGS) ×1 IMPLANT
CHLORAPREP W/TINT 26ML (MISCELLANEOUS) ×4 IMPLANT
CLAMP CORD UMBIL (MISCELLANEOUS) ×2 IMPLANT
CLOTH BEACON ORANGE TIMEOUT ST (SAFETY) ×2 IMPLANT
DRSG OPSITE POSTOP 4X10 (GAUZE/BANDAGES/DRESSINGS) ×2 IMPLANT
DRSG PAD ABDOMINAL 8X10 ST (GAUZE/BANDAGES/DRESSINGS) ×1 IMPLANT
ELECT REM PT RETURN 9FT ADLT (ELECTROSURGICAL) ×2
ELECTRODE REM PT RTRN 9FT ADLT (ELECTROSURGICAL) ×1 IMPLANT
EXTRACTOR VACUUM BELL STYLE (SUCTIONS) IMPLANT
GAUZE SPONGE 4X4 12PLY STRL LF (GAUZE/BANDAGES/DRESSINGS) ×2 IMPLANT
GLOVE BIOGEL PI IND STRL 7.0 (GLOVE) ×2 IMPLANT
GLOVE BIOGEL PI INDICATOR 7.0 (GLOVE) ×2
GLOVE ECLIPSE 7.0 STRL STRAW (GLOVE) ×2 IMPLANT
GOWN STRL REUS W/TWL LRG LVL3 (GOWN DISPOSABLE) ×4 IMPLANT
HEMOSTAT ARISTA ABSORB 3G PWDR (HEMOSTASIS) ×1 IMPLANT
KIT ABG SYR 3ML LUER SLIP (SYRINGE) ×2 IMPLANT
NDL HYPO 25X5/8 SAFETYGLIDE (NEEDLE) ×1 IMPLANT
NEEDLE HYPO 25X5/8 SAFETYGLIDE (NEEDLE) ×2 IMPLANT
NS IRRIG 1000ML POUR BTL (IV SOLUTION) ×2 IMPLANT
PACK C SECTION WH (CUSTOM PROCEDURE TRAY) ×2 IMPLANT
PAD OB MATERNITY 4.3X12.25 (PERSONAL CARE ITEMS) ×2 IMPLANT
RTRCTR C-SECT PINK 25CM LRG (MISCELLANEOUS) ×2 IMPLANT
STRIP CLOSURE SKIN 1/2X4 (GAUZE/BANDAGES/DRESSINGS) ×1 IMPLANT
SUT MNCRL 0 VIOLET CTX 36 (SUTURE) ×2 IMPLANT
SUT MONOCRYL 0 CTX 36 (SUTURE) ×4
SUT PLAIN 0 NONE (SUTURE) IMPLANT
SUT PLAIN 2 0 (SUTURE)
SUT PLAIN 2 0 XLH (SUTURE) IMPLANT
SUT PLAIN ABS 2-0 CT1 27XMFL (SUTURE) IMPLANT
SUT VIC AB 0 CTX 36 (SUTURE) ×2
SUT VIC AB 0 CTX36XBRD ANBCTRL (SUTURE) ×1 IMPLANT
SUT VIC AB 2-0 CT1 27 (SUTURE)
SUT VIC AB 2-0 CT1 TAPERPNT 27 (SUTURE) IMPLANT
SUT VIC AB 4-0 KS 27 (SUTURE) ×2 IMPLANT
TAPE CLOTH SURG 6X10 WHT LF (GAUZE/BANDAGES/DRESSINGS) ×1 IMPLANT
TOWEL OR 17X24 6PK STRL BLUE (TOWEL DISPOSABLE) ×2 IMPLANT
TRAY FOLEY W/BAG SLVR 14FR LF (SET/KITS/TRAYS/PACK) IMPLANT
WATER STERILE IRR 1000ML POUR (IV SOLUTION) ×2 IMPLANT

## 2021-07-18 NOTE — Anesthesia Procedure Notes (Signed)
Spinal  Patient location during procedure: OR Start time: 07/18/2021 12:51 PM End time: 07/18/2021 12:54 PM Reason for block: surgical anesthesia Staffing Performed: anesthesiologist  Anesthesiologist: Lannie Fields, DO Performed by: Lannie Fields, DO Authorized by: Lannie Fields, DO   Preanesthetic Checklist Completed: patient identified, IV checked, risks and benefits discussed, surgical consent, monitors and equipment checked, pre-op evaluation and timeout performed Spinal Block Patient position: sitting Prep: DuraPrep and site prepped and draped Patient monitoring: cardiac monitor, continuous pulse ox and blood pressure Approach: midline Location: L3-4 Injection technique: single-shot Needle Needle type: Pencan  Needle gauge: 24 G Needle length: 9 cm Assessment Sensory level: T6 Events: CSF return Additional Notes Functioning IV was confirmed and monitors were applied. Sterile prep and drape, including hand hygiene and sterile gloves were used. The patient was positioned and the spine was prepped. The skin was anesthetized with lidocaine.  Free flow of clear CSF was obtained prior to injecting local anesthetic into the CSF.  The spinal needle aspirated freely following injection.  The needle was carefully withdrawn.  The patient tolerated the procedure well.

## 2021-07-18 NOTE — Discharge Summary (Signed)
Postpartum Discharge Summary  Date of Service updated  Patient Name: Michelle Chen DOB: 2000-05-17 MRN: [id number]  Date of admission: 07/18/2021 Delivery date:07/18/2021  Delivering provider: Worthy Rancher  Date of discharge: 07/20/2021  Admitting diagnosis: Breech Intrauterine pregnancy: [redacted]w[redacted]d     Secondary diagnosis:  Principal Problem:   S/P cesarean section Active Problems:   Supervision of high risk pregnancy, antepartum   Gestational diabetes   GBS (group B Streptococcus carrier), +RV culture, currently pregnant   Breech presentation of fetus  Additional problems: None    Discharge diagnosis: Term Pregnancy Delivered                                              Post partum procedures:  None Augmentation: N/A Complications: None  Hospital course: Sceduled C/S - 21 y.o. yo G1P1001 at [redacted]w[redacted]d was admitted to the hospital 07/18/2021 for scheduled cesarean section with the following indication:  Malpresentation (breech).  Delivery details are as follows:  Membrane Rupture Time/Date: 1:08 PM ,07/18/2021   Delivery Method:C-Section, Low Transverse  Details of operation can be found in separate operative note.  Patient had an uncomplicated postpartum course.  Her hemoglobin on POD#1 was 11.0.  Her fasting CBG was 77.  She will have a GTT at her 6 week postpartum visit.  She is ambulating, tolerating a regular diet, passing flatus, and urinating well.  Her pain and bleeding are controlled.  She is both breast and bottlefeeding well.  Patient is discharged home in stable condition on  07/20/21        Newborn Data: Birth date:07/18/2021  Birth time:1:09 PM  Gender:Female  Living status:Living  Apgars:8 ,9  Weight:3827 g     Magnesium Sulfate received: No BMZ received: No Rhophylac: N/A MMR: N/A - Immune  T-DaP: Given prenatally Flu: No Transfusion: No  Physical exam  Vitals:   07/19/21 0306 07/19/21 0607 07/19/21 2043 07/20/21 0520  BP: 126/73 118/82 (!) 113/57  106/61  Pulse: 83 (!) 104 84 72  Resp: 18 18 18 18   Temp: 97.9 F (36.6 C)  (!) 97.5 F (36.4 C) 98 F (36.7 C)  TempSrc: Oral  Oral Oral  SpO2:    98%  Weight:      Height:       General: alert, cooperative, and no distress Lochia: appropriate Uterine Fundus: firm Incision: Healing well with no significant drainage DVT Evaluation: No cords or calf tenderness.  Labs: Lab Results  Component Value Date   WBC 16.6 (H) 07/19/2021   HGB 11.0 (L) 07/19/2021   HCT 32.3 (L) 07/19/2021   MCV 94.7 07/19/2021   PLT 252 07/19/2021      Latest Ref Rng & Units 07/19/2021    4:03 AM  CMP  Creatinine 0.44 - 1.00 mg/dL 2.13    Edinburgh Score:    07/19/2021    9:15 AM  Edinburgh Postnatal Depression Scale Screening Tool  I have been able to laugh and see the funny side of things. 0  I have looked forward with enjoyment to things. 0  I have blamed myself unnecessarily when things went wrong. 0  I have been anxious or worried for no good reason. 0  I have felt scared or panicky for no good reason. 0  Things have been getting on top of me. 1  I have been so unhappy that I  have had difficulty sleeping. 0  I have felt sad or miserable. 0  I have been so unhappy that I have been crying. 0  The thought of harming myself has occurred to me. 0  Edinburgh Postnatal Depression Scale Total 1    After visit meds:  Allergies as of 07/20/2021   No Known Allergies      Medication List     STOP taking these medications    Accu-Chek Guide test strip Generic drug: glucose blood   Accu-Chek Guide w/Device Kit   Accu-Chek Softclix Lancets lancets       TAKE these medications    acetaminophen 500 MG tablet Commonly known as: TYLENOL Take 2 tablets (1,000 mg total) by mouth every 6 (six) hours.   Blood Pressure Kit Devi 1 Device by Does not apply route daily.   etonogestrel 68 MG Impl implant Commonly known as: NEXPLANON 1 each (68 mg total) by Subdermal route once for 1  dose.   ibuprofen 600 MG tablet Commonly known as: ADVIL Take 1 tablet (600 mg total) by mouth every 6 (six) hours.   oxyCODONE 5 MG immediate release tablet Commonly known as: Oxy IR/ROXICODONE Take 1 tablet (5 mg total) by mouth every 4 (four) hours as needed for moderate pain or severe pain.   Prenatal 27-1 MG Tabs Take 1 tablet by mouth daily.               Discharge Care Instructions  (From admission, onward)           Start     Ordered   07/20/21 0000  Discharge wound care:       Comments: If dressing is present, remove after 7 days from surgery   07/20/21 1120            Discharge home in stable condition Infant Feeding: Bottle and Breast Infant Disposition: home with mother Discharge instruction: per After Visit Summary and Postpartum booklet. Activity: Advance as tolerated. Pelvic rest for 6 weeks.  Diet: routine diet Future Appointments: Future Appointments  Date Time Provider Department Center  07/25/2021 10:00 AM [facility] NURSE [facility]  08/19/2021  3:35 PM Venora Maples, MD [facility]   Follow up Visit:  Follow-up Information     Venora Maples, MD Follow up.   Specialty: Family Medicine Why: One week for incision check and 4-6 weeks for postpartum visit. Contact information: [address] [place] [address] [phone]                Message sent to [id number] by Dr. Mathis Fare on 07/18/20.   Please schedule this patient for a In person postpartum visit in 6 weeks with the following provider: [facility]. Additional Postpartum F/U: 2 hour GTT  High risk pregnancy complicated by: GDM and breech presentation  Delivery mode:  C-Section, Low Transverse  Anticipated Birth Control:  Nexplanon  07/20/2021 Milas Hock, MD

## 2021-07-19 LAB — CBC
HCT: 32.3 % — ABNORMAL LOW (ref 36.0–46.0)
Hemoglobin: 11 g/dL — ABNORMAL LOW (ref 12.0–15.0)
MCH: 32.3 pg (ref 26.0–34.0)
MCHC: 34.1 g/dL (ref 30.0–36.0)
MCV: 94.7 fL (ref 80.0–100.0)
Platelets: 252 10*3/uL (ref 150–400)
RBC: 3.41 MIL/uL — ABNORMAL LOW (ref 3.87–5.11)
RDW: 13 % (ref 11.5–15.5)
WBC: 16.6 10*3/uL — ABNORMAL HIGH (ref 4.0–10.5)
nRBC: 0 % (ref 0.0–0.2)

## 2021-07-19 LAB — CREATININE, SERUM
Creatinine, Ser: 0.62 mg/dL (ref 0.44–1.00)
GFR, Estimated: >60 mL/min (ref >60)

## 2021-07-20 ENCOUNTER — Encounter (HOSPITAL_COMMUNITY): Payer: Self-pay | Admitting: Family Medicine

## 2021-07-20 DIAGNOSIS — Z30017 Encounter for initial prescription of implantable subdermal contraceptive: Secondary | ICD-10-CM

## 2021-07-20 HISTORY — PX: NEXPLANON TRAY: NUR84248

## 2021-07-20 MED ORDER — ETONOGESTREL 68 MG ~~LOC~~ IMPL
68.0000 mg | DRUG_IMPLANT | Freq: Once | SUBCUTANEOUS | Status: AC
  Administered 2021-07-20: 68 mg via SUBCUTANEOUS
  Filled 2021-07-20: qty 1

## 2021-07-20 MED ORDER — LIDOCAINE HCL 1 % IJ SOLN
0.0000 mL | Freq: Once | INTRAMUSCULAR | Status: AC | PRN
  Administered 2021-07-20: 20 mL via INTRADERMAL
  Filled 2021-07-20: qty 20

## 2021-07-20 MED ORDER — OXYCODONE HCL 5 MG PO TABS
5.0000 mg | ORAL_TABLET | ORAL | 0 refills | Status: DC | PRN
Start: 2021-07-20 — End: 2021-08-18

## 2021-07-20 MED ORDER — IBUPROFEN 600 MG PO TABS: 600.0000 mg | ORAL_TABLET | Freq: Four times a day (QID) | ORAL | 0 refills | Status: DC

## 2021-07-20 MED ORDER — ETONOGESTREL 68 MG ~~LOC~~ IMPL: 68.0000 mg | DRUG_IMPLANT | Freq: Once | SUBCUTANEOUS | 0 refills | Status: AC

## 2021-07-20 MED ORDER — ACETAMINOPHEN 500 MG PO TABS: 1000.0000 mg | ORAL_TABLET | Freq: Four times a day (QID) | ORAL | 0 refills | Status: DC

## 2021-07-24 ENCOUNTER — Encounter: Payer: Self-pay | Admitting: Family Medicine

## 2021-07-24 ENCOUNTER — Telehealth: Payer: Self-pay

## 2021-07-24 ENCOUNTER — Encounter: Payer: Medicaid Other | Admitting: Family Medicine

## 2021-07-24 NOTE — Telephone Encounter (Signed)
Patient called office to speak with nurse. Patient asks if it's okay to remove c-section dressing. Pt reports honeycomb dressing is lifting up and small white tape is underneath. Encouraged pt to remove honeycomb dressing and take shower today, being sure to let warm soapy water run over incision. Explained the steri strips will likely fall off on their own or nurse can remove them at visit tomorrow. Pt verbalizes understand and expresses intent to attend appt tomorrow with nurse at 10 AM.

## 2021-07-25 ENCOUNTER — Ambulatory Visit (INDEPENDENT_AMBULATORY_CARE_PROVIDER_SITE_OTHER): Payer: Medicaid Other

## 2021-07-25 ENCOUNTER — Other Ambulatory Visit: Payer: Self-pay

## 2021-07-25 VITALS — BP 126/88 | HR 61 | Wt 207.2 lb

## 2021-07-25 DIAGNOSIS — Z5189 Encounter for other specified aftercare: Secondary | ICD-10-CM

## 2021-07-25 NOTE — Progress Notes (Signed)
Incision Check Visit  Michelle Chen is here for incision check following primary c-section on 07/18/21. Steri strips removed. Incision is clean, dry, and intact. Reviewed good wound care and s/s of infection. Pt reports pain is well controlled with ibuprofen. Reports some constipation, last BM yesterday or day before. Has purchased OTC stool softener. Recommended Miralax if otc stool softener is not effective. Continues to have issues with latch during feeding. Will return next week for visit with lactation.  Reviewed need for GTT with PP visit. Explained I will review schedule with [facility] RN and office will notify her of new appt.  Marjo Bicker, RN 07/25/2021  9:55 AM

## 2021-07-26 DIAGNOSIS — Z419 Encounter for procedure for purposes other than remedying health state, unspecified: Secondary | ICD-10-CM | POA: Diagnosis not present

## 2021-07-29 ENCOUNTER — Telehealth: Payer: Medicaid Other | Admitting: Physician Assistant

## 2021-07-29 NOTE — Progress Notes (Signed)
Trying to do visit for her newborn through her own chart. Let her know we cannot see infants via our department. She has reached out to her OB but has not gotten response back. Resources given so she can get her little one evaluated today.

## 2021-07-29 NOTE — Patient Instructions (Signed)
Go to [email or web address] to select option for newborns and up if you are not wanting to wait to see the OB/pediatrician in the morning.  Start applying warm compress to the affected eye for 10 minutes a few times per day to promote drainage.  Try to avoid rubbing the baby's eye. Keep your hands washed.

## 2021-08-06 ENCOUNTER — Other Ambulatory Visit: Payer: Self-pay

## 2021-08-06 DIAGNOSIS — O24419 Gestational diabetes mellitus in pregnancy, unspecified control: Secondary | ICD-10-CM

## 2021-08-18 ENCOUNTER — Ambulatory Visit (INDEPENDENT_AMBULATORY_CARE_PROVIDER_SITE_OTHER): Payer: Medicaid Other | Admitting: Family Medicine

## 2021-08-18 ENCOUNTER — Other Ambulatory Visit: Payer: Self-pay

## 2021-08-18 ENCOUNTER — Other Ambulatory Visit: Payer: Medicaid Other

## 2021-08-18 DIAGNOSIS — O24419 Gestational diabetes mellitus in pregnancy, unspecified control: Secondary | ICD-10-CM | POA: Diagnosis not present

## 2021-08-18 DIAGNOSIS — Z8632 Personal history of gestational diabetes: Secondary | ICD-10-CM

## 2021-08-18 NOTE — Progress Notes (Signed)
Post Partum Visit Note  Michelle Chen is a 21 y.o. G81P1001 female who presents for a postpartum visit. She is 4 weeks postpartum following a primary cesarean section.  I have fully reviewed the prenatal and intrapartum course. The delivery was at 39.0 gestational weeks.  Anesthesia: spinal. Postpartum course has been normal. Baby is doing wel. Baby is feeding by both breast and bottle - Enfamil Lipil. Bleeding staining only. Bowel function is normal. Bladder function is normal. Patient is sexually active. Contraception method is Nexplanon. Postpartum depression screening: negative.   The pregnancy intention screening data noted above was reviewed. Potential methods of contraception were discussed. The patient elected to proceed with No data recorded.   Edinburgh Postnatal Depression Scale - 08/18/21 1004       Edinburgh Postnatal Depression Scale:  In the Past 7 Days   I have been able to laugh and see the funny side of things. 0    I have looked forward with enjoyment to things. 0    I have blamed myself unnecessarily when things went wrong. 0    I have been anxious or worried for no good reason. 0    I have felt scared or panicky for no good reason. 0    Things have been getting on top of me. 0    I have been so unhappy that I have had difficulty sleeping. 0    I have felt sad or miserable. 0    I have been so unhappy that I have been crying. 0    The thought of harming myself has occurred to me. 0    Edinburgh Postnatal Depression Scale Total 0             Health Maintenance Due  Topic Date Due   COVID-19 Vaccine (1) Never done   URINE MICROALBUMIN  Never done   HPV VACCINES (3 - 3-dose series) 12/13/2016   PAP-Cervical Cytology Screening  Never done   PAP SMEAR-Modifier  Never done    The following portions of the patient's history were reviewed and updated as appropriate: allergies, current medications, past family history, past medical history, past social history,  past surgical history, and problem list.  Review of Systems Pertinent items noted in HPI and remainder of comprehensive ROS otherwise negative.  Objective:  BP (!) 124/57   Pulse 95   Ht 5\' 3"  (1.6 m)   Wt 193 lb 3.2 oz (87.6 kg)   LMP 10/22/2020   BMI 34.22 kg/m    General:  alert, cooperative, and appears stated age  Lungs: Normal effort  Heart:  regular rate and rhythm  Abdomen: soft, non-tender; bowel sounds normal; no masses,  no organomegaly   Wound well approximated incision       Assessment:   Normal postpartum exam.   Plan:   Essential components of care per ACOG recommendations:  1.  Mood and well being: Patient with negative depression screening today. Reviewed local resources for support.  - Patient tobacco use? No.   - hx of drug use? No.    2. Infant care and feeding:  -Patient currently breastmilk feeding? Yes. Reviewed importance of draining breast regularly to support lactation.  -Social determinants of health (SDOH) reviewed in EPIC. No concerns  3. Sexuality, contraception and birth spacing - Patient does not want a pregnancy in the next year.  Desired family size is 1 children.  - Reviewed reproductive life planning. Reviewed contraceptive methods based on pt preferences and effectiveness.  Patient desired Hormonal Implant today.   - Discussed birth spacing of 18 months  4. Sleep and fatigue -Encouraged family/partner/community support of 4 hrs of uninterrupted sleep to help with mood and fatigue  5. Physical Recovery  - Discussed patients delivery and complications. She describes her delivery as good. - Patient had a C-section.   - Patient has urinary incontinence? No. - Patient is safe to resume physical and sexual activity  6.  Health Maintenance - HM due items addressed Yes - Last pap smear 02/2020 at [facility]--results reviewed--Pap smear not done at today's visit.  -Breast Cancer screening indicated? No.   7. Chronic Disease/Pregnancy  Condition follow up: Gestational Diabetes 2 hour OGT today - PCP follow up  Reva Bores, MD [facility]

## 2021-08-19 ENCOUNTER — Ambulatory Visit: Payer: Medicaid Other | Admitting: Family Medicine

## 2021-08-19 LAB — GLUCOSE TOLERANCE, 2 HOURS
Glucose, 2 hour: 116 mg/dL (ref 70–139)
Glucose, GTT - Fasting: 91 mg/dL (ref 70–99)

## 2021-08-26 DIAGNOSIS — Z419 Encounter for procedure for purposes other than remedying health state, unspecified: Secondary | ICD-10-CM | POA: Diagnosis not present

## 2021-08-31 ENCOUNTER — Encounter (HOSPITAL_COMMUNITY): Payer: Self-pay | Admitting: Family Medicine

## 2021-09-26 DIAGNOSIS — Z419 Encounter for procedure for purposes other than remedying health state, unspecified: Secondary | ICD-10-CM | POA: Diagnosis not present

## 2021-10-15 DIAGNOSIS — Z114 Encounter for screening for human immunodeficiency virus [HIV]: Secondary | ICD-10-CM | POA: Diagnosis not present

## 2021-10-15 DIAGNOSIS — E669 Obesity, unspecified: Secondary | ICD-10-CM | POA: Diagnosis not present

## 2021-10-15 DIAGNOSIS — Z8632 Personal history of gestational diabetes: Secondary | ICD-10-CM | POA: Diagnosis not present

## 2021-10-15 DIAGNOSIS — Z113 Encounter for screening for infections with a predominantly sexual mode of transmission: Secondary | ICD-10-CM | POA: Diagnosis not present

## 2021-10-15 DIAGNOSIS — Z6833 Body mass index (BMI) 33.0-33.9, adult: Secondary | ICD-10-CM | POA: Diagnosis not present

## 2021-10-15 DIAGNOSIS — Z0000 Encounter for general adult medical examination without abnormal findings: Secondary | ICD-10-CM | POA: Diagnosis not present

## 2021-10-15 DIAGNOSIS — N898 Other specified noninflammatory disorders of vagina: Secondary | ICD-10-CM | POA: Diagnosis not present

## 2021-10-26 DIAGNOSIS — Z419 Encounter for procedure for purposes other than remedying health state, unspecified: Secondary | ICD-10-CM | POA: Diagnosis not present

## 2021-11-16 DIAGNOSIS — Z3483 Encounter for supervision of other normal pregnancy, third trimester: Secondary | ICD-10-CM | POA: Diagnosis not present

## 2021-11-16 DIAGNOSIS — Z3482 Encounter for supervision of other normal pregnancy, second trimester: Secondary | ICD-10-CM | POA: Diagnosis not present

## 2021-11-26 DIAGNOSIS — Z419 Encounter for procedure for purposes other than remedying health state, unspecified: Secondary | ICD-10-CM | POA: Diagnosis not present

## 2021-12-17 ENCOUNTER — Encounter (HOSPITAL_COMMUNITY): Payer: Self-pay

## 2021-12-17 ENCOUNTER — Ambulatory Visit (HOSPITAL_COMMUNITY)
Admission: RE | Admit: 2021-12-17 | Discharge: 2021-12-17 | Disposition: A | Discharge status: Home / Self Care | Payer: Medicaid Other | Source: Ambulatory Visit | Attending: Nurse Practitioner | Admitting: Nurse Practitioner

## 2021-12-17 VITALS — BP 128/77 | HR 86 | Temp 98.3°F | Resp 16

## 2021-12-17 DIAGNOSIS — N760 Acute vaginitis: Secondary | ICD-10-CM | POA: Insufficient documentation

## 2021-12-17 MED ORDER — FLUCONAZOLE 150 MG PO TABS: 150.0000 mg | ORAL_TABLET | Freq: Every day | ORAL | 0 refills | Status: AC

## 2021-12-17 NOTE — Discharge Instructions (Addendum)
Diflucan as prescribed Clinical contact you with lab results if they are positive otherwise check your MyChart for these results Follow-up with your PCP if your symptoms or not improving Please go to the emergency room for any worsening symptoms I hope you feel better soon!

## 2021-12-17 NOTE — ED Triage Notes (Signed)
Pt reports vaginal itching and discharge. Symptoms started yesterday.  Denies otc medication

## 2021-12-17 NOTE — ED Provider Notes (Addendum)
[facility]    CSN: [id number] Arrival date & time: 12/17/21  1843      History   Chief Complaint Chief Complaint  Patient presents with   Vaginal Itching    Plus discharge - Entered by patient    HPI Michelle Chen is a 21 y.o. female presents for evaluation of vaginal discharge x 1 day.  Patient reports a pruritic, thick, malodorous discharge since yesterday.  Denies any dysuria, fevers, flank pain, abdominal pain.  No STD concern or exposure.  She does have a history of yeast infections and states this feels similar.  She has not used any OTC medications since onset of symptoms.  No other concerns at this time.   Vaginal Itching    Past Medical History:  Diagnosis Date   Gestational diabetes    Medical history non-contributory    Vaginal Pap smear, abnormal     Patient Active Problem List   Diagnosis Date Noted   S/P cesarean section 07/18/2021   History of gestational diabetes 05/08/2021    Past Surgical History:  Procedure Laterality Date   CESAREAN SECTION N/A 07/18/2021   Procedure: CESAREAN SECTION;  Surgeon: Venora Maples, MD;  Location: [facility];  Service: Obstetrics;  Laterality: N/A;   MOUTH SURGERY     NEXPLANON TRAY  07/20/2021   NO PAST SURGERIES      OB History     Gravida  1   Para  1   Term  1   Preterm  0   AB  0   Living  1      SAB  0   IAB  0   Ectopic  0   Multiple  0   Live Births  1            Home Medications    Prior to Admission medications   Medication Sig Start Date End Date Taking? Authorizing Provider  etonogestrel (NEXPLANON) 68 MG IMPL implant 1 each (68 mg total) by Subdermal route once for 1 dose. 07/20/21 07/20/21  Milas Hock, MD  Prenatal 27-1 MG TABS Take 1 tablet by mouth daily. 12/31/20   Venora Maples, MD  omeprazole (PRILOSEC) 20 MG capsule Take 1 capsule (20 mg total) by mouth daily. 05/09/18 10/30/19  Janace Aris, NP    Family History Family History  Problem  Relation Age of Onset   Learning disabilities Sister    Asthma Sister    Asthma Brother    Asthma Maternal Grandmother    Alzheimer's disease Maternal Grandfather     Social History Social History   Tobacco Use   Smoking status: Never    Passive exposure: Past   Smokeless tobacco: Never   Tobacco comments:    gma smokes in and out of the home   Vaping Use   Vaping Use: Never used  Substance Use Topics   Alcohol use: No    Alcohol/week: 0.0 standard drinks of alcohol   Drug use: No     Allergies   Patient has no known allergies.   Review of Systems Review of Systems  Genitourinary:  Positive for vaginal discharge.     Physical Exam Triage Vital Signs ED Triage Vitals  Enc Vitals Group     BP 12/17/21 1910 128/77     Pulse Rate 12/17/21 1910 86     Resp 12/17/21 1910 16     Temp 12/17/21 1910 98.3 F (36.8 C)     Temp Source  12/17/21 1910 Oral     SpO2 12/17/21 1910 98 %     Weight --      Height --      Head Circumference --      Peak Flow --      Pain Score 12/17/21 1909 0     Pain Loc --      Pain Edu? --      Excl. in GC? --    No data found.  Updated Vital Signs BP 128/77 (BP Location: Left Arm)   Pulse 86   Temp 98.3 F (36.8 C) (Oral)   Resp 16   SpO2 98%   Visual Acuity Right Eye Distance:   Left Eye Distance:   Bilateral Distance:    Right Eye Near:   Left Eye Near:    Bilateral Near:     Physical Exam Vitals and nursing note reviewed.  Constitutional:      General: She is not in acute distress.    Appearance: Normal appearance. She is not ill-appearing.  HENT:     Head: Normocephalic and atraumatic.  Eyes:     Pupils: Pupils are equal, round, and reactive to light.  Cardiovascular:     Rate and Rhythm: Normal rate.  Pulmonary:     Effort: Pulmonary effort is normal.  Abdominal:     Tenderness: There is no right CVA tenderness or left CVA tenderness.  Genitourinary:    Comments: Pt opted for self swab  Skin:     General: Skin is warm and dry.  Neurological:     General: No focal deficit present.     Mental Status: She is alert and oriented to person, place, and time.  Psychiatric:        Mood and Affect: Mood normal.        Behavior: Behavior normal.      UC Treatments / Results  Labs (all labs ordered are listed, but only abnormal results are displayed) Labs Reviewed  POCT URINALYSIS DIPSTICK, ED / UC  POC URINE PREG, ED    EKG   Radiology No results found.  Procedures Procedures (including critical care time)  Medications Ordered in UC Medications - No data to display  Initial Impression / Assessment and Plan / UC Course  I have reviewed the triage vital signs and the nursing notes.  Pertinent labs & imaging results that were available during my care of the patient were reviewed by me and considered in my medical decision making (see chart for details).     Start Diflucan and will contact patient if vaginal swab positive Patient declined STD testing Patient declined UA and hCG as she is unable to leave urine sample and did not want to wait to provide one. Pt started menses today  Follow-up with PCP 2 to 3 days for recheck Strict ER precautions reviewed and patient verbalized understanding   Final Clinical Impressions(s) / UC Diagnoses   Final diagnoses:  None   Discharge Instructions   None    ED Prescriptions   None    PDMP not reviewed this encounter.   Revonda Standard, NP 12/17/21 1923    Revonda Standard, NP 12/17/21 [phone]

## 2021-12-18 LAB — CERVICOVAGINAL ANCILLARY ONLY
Bacterial Vaginitis (gardnerella): NEGATIVE
Candida Glabrata: NEGATIVE
Candida Vaginitis: POSITIVE — AB
Comment: NEGATIVE
Comment: NEGATIVE
Comment: NEGATIVE
Comment: NEGATIVE
Trichomonas: NEGATIVE

## 2021-12-26 DIAGNOSIS — Z419 Encounter for procedure for purposes other than remedying health state, unspecified: Secondary | ICD-10-CM | POA: Diagnosis not present

## 2022-01-26 DIAGNOSIS — Z419 Encounter for procedure for purposes other than remedying health state, unspecified: Secondary | ICD-10-CM | POA: Diagnosis not present

## 2022-02-26 DIAGNOSIS — Z419 Encounter for procedure for purposes other than remedying health state, unspecified: Secondary | ICD-10-CM | POA: Diagnosis not present

## 2022-03-27 DIAGNOSIS — Z419 Encounter for procedure for purposes other than remedying health state, unspecified: Secondary | ICD-10-CM | POA: Diagnosis not present

## 2022-04-27 DIAGNOSIS — Z419 Encounter for procedure for purposes other than remedying health state, unspecified: Secondary | ICD-10-CM | POA: Diagnosis not present

## 2022-05-02 ENCOUNTER — Ambulatory Visit (HOSPITAL_COMMUNITY): Payer: Medicaid Other

## 2022-05-02 ENCOUNTER — Ambulatory Visit (HOSPITAL_COMMUNITY)
Admission: EM | Admit: 2022-05-02 | Discharge: 2022-05-02 | Disposition: A | Discharge status: Home / Self Care | Payer: Medicaid Other | Attending: Family Medicine | Admitting: Family Medicine

## 2022-05-02 ENCOUNTER — Encounter (HOSPITAL_COMMUNITY): Payer: Self-pay | Admitting: *Deleted

## 2022-05-02 ENCOUNTER — Ambulatory Visit (HOSPITAL_COMMUNITY): Payer: Self-pay

## 2022-05-02 DIAGNOSIS — R3915 Urgency of urination: Secondary | ICD-10-CM | POA: Insufficient documentation

## 2022-05-02 DIAGNOSIS — R350 Frequency of micturition: Secondary | ICD-10-CM | POA: Diagnosis not present

## 2022-05-02 DIAGNOSIS — N3090 Cystitis, unspecified without hematuria: Secondary | ICD-10-CM | POA: Insufficient documentation

## 2022-05-02 LAB — POCT URINALYSIS DIPSTICK, ED / UC
Bilirubin Urine: NEGATIVE
Glucose, UA: NEGATIVE mg/dL
Ketones, ur: NEGATIVE mg/dL
Nitrite: NEGATIVE
Protein, ur: 30 mg/dL — AB
Specific Gravity, Urine: 1.025 (ref 1.005–1.030)
Urobilinogen, UA: 0.2 mg/dL (ref 0.0–1.0)
pH: 6 (ref 5.0–8.0)

## 2022-05-02 LAB — POC URINE PREG, ED: Preg Test, Ur: NEGATIVE

## 2022-05-02 LAB — CBG MONITORING, ED: Glucose-Capillary: 70 mg/dL (ref 70–99)

## 2022-05-02 MED ORDER — CEPHALEXIN 500 MG PO CAPS: 500.0000 mg | ORAL_CAPSULE | Freq: Two times a day (BID) | ORAL | 0 refills | Status: DC

## 2022-05-02 NOTE — ED Provider Notes (Signed)
[facility]    ASSESSMENT & PLAN:  1. Urinary urgency   2. Urinary frequency   3. Cystitis    Begin: Meds ordered this encounter  Medications   cephALEXin (KEFLEX) 500 MG capsule    Sig: Take 1 capsule (500 mg total) by mouth 2 (two) times daily.    Dispense:  10 capsule    Refill:  0   No signs of pyelonephritis.  Results for orders placed or performed during the hospital encounter of 05/02/22  POC Urinalysis dipstick  Result Value Ref Range   Glucose, UA NEGATIVE NEGATIVE mg/dL   Bilirubin Urine NEGATIVE NEGATIVE   Ketones, ur NEGATIVE NEGATIVE mg/dL   Specific Gravity, Urine 1.025 1.005 - 1.030   Hgb urine dipstick LARGE (A) NEGATIVE   pH 6.0 5.0 - 8.0   Protein, ur 30 (A) NEGATIVE mg/dL   Urobilinogen, UA 0.2 0.0 - 1.0 mg/dL   Nitrite NEGATIVE NEGATIVE   Leukocytes,Ua SMALL (A) NEGATIVE  POC urine pregnancy  Result Value Ref Range   Preg Test, Ur NEGATIVE NEGATIVE  POC CBG monitoring  Result Value Ref Range   Glucose-Capillary 70 70 - 99 mg/dL   Urine culture sent. Will notify patient of any significant results. Will follow up with her PCP or here if not showing improvement over the next 48 hours, sooner if needed.  Outlined signs and symptoms indicating need for more acute intervention. Patient verbalized understanding. After Visit Summary given.  SUBJECTIVE:  Michelle Chen is a 22 y.o. female who complains of urinary frequency/urgency; on/off over past week, noted more over past few days. No specific aggravating or alleviating factors reported. No LE edema. Normal PO intake without n/v/d. Without specific abdominal pain. Ambulatory without difficulty.  LMP: No LMP recorded. Patient has had an implant.  OBJECTIVE:  Vitals:   05/02/22 1650  BP: 136/84  Pulse: 92  Resp: 16  Temp: 98.3 F (36.8 C)  TempSrc: Oral  SpO2: 98%   General appearance: alert; no distress HENT: oropharynx: moist Lungs: unlabored respirations Abdomen:  soft Skin: warm and dry Neurologic: normal gait Psychological: alert and cooperative; normal mood and affect  Labs Reviewed  POCT URINALYSIS DIPSTICK, ED / UC - Abnormal; Notable for the following components:      Result Value   Hgb urine dipstick LARGE (*)    Protein, ur 30 (*)    Leukocytes,Ua SMALL (*)    All other components within normal limits  URINE CULTURE  POC URINE PREG, ED  CBG MONITORING, ED    No Known Allergies  Past Medical History:  Diagnosis Date   Gestational diabetes    Medical history non-contributory    Vaginal Pap smear, abnormal    Social History   Socioeconomic History   Marital status: Single    Spouse name: Not on file   Number of children: Not on file   Years of education: Not on file   Highest education level: Not on file  Occupational History   Not on file  Tobacco Use   Smoking status: Never    Passive exposure: Past   Smokeless tobacco: Never   Tobacco comments:    gma smokes in and out of the home   Vaping Use   Vaping Use: Never used  Substance and Sexual Activity   Alcohol use: Not Currently    Comment: occasionally   Drug use: Not Currently   Sexual activity: Yes    Birth control/protection: Implant  Other Topics Concern  Not on file  Social History Narrative   Mariyam lives with her maternal grandparents and siblings. Mother is local and has contact with the family.       Social Determinants of Health   Financial Resource Strain: Not on file  Food Insecurity: Not on file  Transportation Needs: Not on file  Physical Activity: Not on file  Stress: Not on file  Social Connections: Not on file  Intimate Partner Violence: Not on file   Family History  Problem Relation Age of Onset   Learning disabilities Sister    Asthma Sister    Asthma Brother    Asthma Maternal Grandmother    Alzheimer's disease Maternal Glynda Jaeger, MD 05/02/22 [phone]

## 2022-05-02 NOTE — Discharge Instructions (Signed)
You have had labs (urine culture) sent today. We will call you with any significant abnormalities or if there is need to begin or change treatment or pursue further follow up.  You may also review your test results online through MyChart. If you do not have a MyChart account, instructions to sign up should be on your discharge paperwork.  

## 2022-05-02 NOTE — ED Triage Notes (Signed)
C/O urinary urgency and polyuria onset 2 days ago. Denies fevers or any new back pain.

## 2022-05-03 ENCOUNTER — Telehealth: Payer: Medicaid Other | Admitting: Family Medicine

## 2022-05-03 DIAGNOSIS — B3731 Acute candidiasis of vulva and vagina: Secondary | ICD-10-CM

## 2022-05-03 LAB — URINE CULTURE

## 2022-05-03 MED ORDER — FLUCONAZOLE 150 MG PO TABS: 150.0000 mg | ORAL_TABLET | Freq: Every day | ORAL | 0 refills | Status: DC

## 2022-05-03 NOTE — Patient Instructions (Signed)

## 2022-05-03 NOTE — Progress Notes (Signed)
Virtual Visit Consent   Michelle Chen, you are scheduled for a virtual visit with a [facility] provider today. Just as with appointments in the office, your consent must be obtained to participate. Your consent will be active for this visit and any virtual visit you may have with one of our providers in the next 365 days. If you have a MyChart account, a copy of this consent can be sent to you electronically.  As this is a virtual visit, video technology does not allow for your provider to perform a traditional examination. This may limit your provider's ability to fully assess your condition. If your provider identifies any concerns that need to be evaluated in person or the need to arrange testing (such as labs, EKG, etc.), we will make arrangements to do so. Although advances in technology are sophisticated, we cannot ensure that it will always work on either your end or our end. If the connection with a video visit is poor, the visit may have to be switched to a telephone visit. With either a video or telephone visit, we are not always able to ensure that we have a secure connection.  By engaging in this virtual visit, you consent to the provision of healthcare and authorize for your insurance to be billed (if applicable) for the services provided during this visit. Depending on your insurance coverage, you may receive a charge related to this service.  I need to obtain your verbal consent now. Are you willing to proceed with your visit today? Michelle Chen has provided verbal consent on 05/03/2022 for a virtual visit (video or telephone). Georgana Curio, FNP  Date: 05/03/2022 1:56 PM  Virtual Visit via Video Note   I, Georgana Curio, connected with  Michelle Chen  ([id number], [birth year]) on 05/03/22 at  2:00 PM EDT by a video-enabled telemedicine application and verified that I am speaking with the correct person using two identifiers.  Location: Patient: Virtual Visit Location Patient:  Home Provider: Virtual Visit Location Provider: Home Office   I discussed the limitations of evaluation and management by telemedicine and the availability of in person appointments. The patient expressed understanding and agreed to proceed.    History of Present Illness: Michelle Chen is a 22 y.o. who identifies as a female who was assigned female at birth, and is being seen today for a vaginal yeast infection with thick discharge and itching She was dx yesterday with a UTI and is on antibiotic Itching started today. No odor. Marland Kitchen  HPI: HPI  Problems:  Patient Active Problem List   Diagnosis Date Noted   S/P cesarean section 07/18/2021   History of gestational diabetes 05/08/2021    Allergies: No Known Allergies Medications:  Current Outpatient Medications:    fluconazole (DIFLUCAN) 150 MG tablet, Take 1 tablet (150 mg total) by mouth daily. Take one now and repeat in 1 week if needed., Disp: 2 tablet, Rfl: 0   cephALEXin (KEFLEX) 500 MG capsule, Take 1 capsule (500 mg total) by mouth 2 (two) times daily., Disp: 10 capsule, Rfl: 0   etonogestrel (NEXPLANON) 68 MG IMPL implant, 1 each (68 mg total) by Subdermal route once for 1 dose., Disp: 1 each, Rfl: 0   Prenatal 27-1 MG TABS, Take 1 tablet by mouth daily., Disp: 30 tablet, Rfl: 0  Observations/Objective: Patient is well-developed, well-nourished in no acute distress.  Resting comfortably  at home.  Head is normocephalic, atraumatic.  No labored breathing.  Speech is clear and coherent with logical content.  Patient is alert and oriented at baseline.    Assessment and Plan: 1. Candidiasis of vagina  Continue antibiotic, UC if sx worsen.   Follow Up Instructions: I discussed the assessment and treatment plan with the patient. The patient was provided an opportunity to ask questions and all were answered. The patient agreed with the plan and demonstrated an understanding of the instructions.  A copy of instructions were sent to  the patient via MyChart unless otherwise noted below.     The patient was advised to call back or seek an in-person evaluation if the symptoms worsen or if the condition fails to improve as anticipated.  Time:  I spent 10 minutes with the patient via telehealth technology discussing the above problems/concerns.    Georgana Curio, FNP

## 2022-05-04 ENCOUNTER — Telehealth (HOSPITAL_COMMUNITY): Payer: Self-pay | Admitting: Emergency Medicine

## 2022-05-04 LAB — URINE CULTURE: Culture: >=100000 — AB

## 2022-05-04 MED ORDER — SULFAMETHOXAZOLE-TRIMETHOPRIM 800-160 MG PO TABS: 1.0000 | ORAL_TABLET | Freq: Two times a day (BID) | ORAL | 0 refills | Status: AC

## 2022-05-27 DIAGNOSIS — Z419 Encounter for procedure for purposes other than remedying health state, unspecified: Secondary | ICD-10-CM | POA: Diagnosis not present

## 2022-06-27 DIAGNOSIS — Z419 Encounter for procedure for purposes other than remedying health state, unspecified: Secondary | ICD-10-CM | POA: Diagnosis not present

## 2022-07-27 DIAGNOSIS — Z419 Encounter for procedure for purposes other than remedying health state, unspecified: Secondary | ICD-10-CM | POA: Diagnosis not present

## 2022-08-01 ENCOUNTER — Ambulatory Visit (INDEPENDENT_AMBULATORY_CARE_PROVIDER_SITE_OTHER): Payer: Medicaid Other

## 2022-08-01 ENCOUNTER — Other Ambulatory Visit: Payer: Self-pay

## 2022-08-01 ENCOUNTER — Encounter (HOSPITAL_COMMUNITY): Payer: Self-pay | Admitting: *Deleted

## 2022-08-01 ENCOUNTER — Ambulatory Visit (HOSPITAL_COMMUNITY)
Admission: EM | Admit: 2022-08-01 | Discharge: 2022-08-01 | Disposition: A | Discharge status: Home / Self Care | Payer: Medicaid Other | Attending: Internal Medicine | Admitting: Internal Medicine

## 2022-08-01 DIAGNOSIS — M79671 Pain in right foot: Secondary | ICD-10-CM | POA: Diagnosis not present

## 2022-08-01 MED ORDER — IBUPROFEN 600 MG PO TABS: 600.0000 mg | ORAL_TABLET | Freq: Four times a day (QID) | ORAL | 0 refills | Status: DC | PRN

## 2022-08-01 NOTE — ED Triage Notes (Signed)
Pt reports she has RT foot pain on ball of foot. Pt reports she may have stepped on something. Pt reports she can not seen any thing stuck in fot. Pain has increased.

## 2022-08-01 NOTE — Discharge Instructions (Signed)
There are not any foreign bodies in your right foot. X-ray is negative. Please follow-up with podiatry (foot doctor) listed on your paperwork in the next 3-4 days if symptoms continue. Ibuprofen 600mg  every 6 hours as needed for pain and swelling. You may do epsom salt soaks to help with symptoms as well.   Return to urgent care as needed.

## 2022-08-01 NOTE — ED Provider Notes (Signed)
[facility]    CSN: [id number] Arrival date & time: 08/01/22  1129      History   Chief Complaint Chief Complaint  Patient presents with   Foot Pain    HPI Michelle Chen is a 22 y.o. female.   Patient presents to urgent care for evaluation of pain and swelling to the ball of the right foot that started approximately 1 week ago.  She states that she was walking around her house barefoot and she believes that she may have stepped on something causing a foreign body to the palm of the right foot.  She is unsure if there was ever a puncture wound to the bottom of the foot but has been experiencing pain and discomfort to the area with ambulation and weightbearing for the last several days.  History of gestational diabetes, no other history of immunosuppression.  No fever, chills, warmth to the ball of the foot, or numbness/tingling distally to the right foot.  She has not attempted use of any over-the-counter medications to help with discomfort.   Foot Pain    Past Medical History:  Diagnosis Date   Gestational diabetes    Medical history non-contributory    Vaginal Pap smear, abnormal     Patient Active Problem List   Diagnosis Date Noted   S/P cesarean section 07/18/2021   History of gestational diabetes 05/08/2021    Past Surgical History:  Procedure Laterality Date   CESAREAN SECTION N/A 07/18/2021   Procedure: CESAREAN SECTION;  Surgeon: Venora Maples, MD;  Location: [facility];  Service: Obstetrics;  Laterality: N/A;   MOUTH SURGERY     NEXPLANON TRAY  07/20/2021    OB History     Gravida  1   Para  1   Term  1   Preterm  0   AB  0   Living  1      SAB  0   IAB  0   Ectopic  0   Multiple  0   Live Births  1            Home Medications    Prior to Admission medications   Medication Sig Start Date End Date Taking? Authorizing Provider  etonogestrel (NEXPLANON) 68 MG IMPL implant 1 each (68 mg total) by Subdermal route  once for 1 dose. 07/20/21 08/01/22 Yes Milas Hock, MD  ibuprofen (ADVIL) 600 MG tablet Take 1 tablet (600 mg total) by mouth every 6 (six) hours as needed. 08/01/22  Yes Carlisle Beers, FNP  cephALEXin (KEFLEX) 500 MG capsule Take 1 capsule (500 mg total) by mouth 2 (two) times daily. 05/02/22   Mardella Layman, MD  fluconazole (DIFLUCAN) 150 MG tablet Take 1 tablet (150 mg total) by mouth daily. Take one now and repeat in 1 week if needed. 05/03/22   Delorse Lek, FNP  Prenatal 27-1 MG TABS Take 1 tablet by mouth daily. 12/31/20   Venora Maples, MD  omeprazole (PRILOSEC) 20 MG capsule Take 1 capsule (20 mg total) by mouth daily. 05/09/18 10/30/19  Janace Aris, NP    Family History Family History  Problem Relation Age of Onset   Learning disabilities Sister    Asthma Sister    Asthma Brother    Asthma Maternal Grandmother    Alzheimer's disease Maternal Grandfather     Social History Social History   Tobacco Use   Smoking status: Never    Passive exposure: Past  Smokeless tobacco: Never   Tobacco comments:    gma smokes in and out of the home   Vaping Use   Vaping Use: Never used  Substance Use Topics   Alcohol use: Not Currently    Comment: occasionally   Drug use: Not Currently     Allergies   Patient has no known allergies.   Review of Systems Review of Systems Per HPI  Physical Exam Triage Vital Signs ED Triage Vitals  Enc Vitals Group     BP 08/01/22 1143 121/86     Pulse Rate 08/01/22 1143 (!) 106     Resp 08/01/22 1143 18     Temp 08/01/22 1143 98.7 F (37.1 C)     Temp src --      SpO2 08/01/22 1143 95 %     Weight --      Height --      Head Circumference --      Peak Flow --      Pain Score 08/01/22 1141 7     Pain Loc --      Pain Edu? --      Excl. in GC? --    No data found.  Updated Vital Signs BP 121/86   Pulse (!) 106   Temp 98.7 F (37.1 C)   Resp 18   SpO2 95%   Visual Acuity Right Eye Distance:   Left Eye Distance:    Bilateral Distance:    Right Eye Near:   Left Eye Near:    Bilateral Near:     Physical Exam Vitals and nursing note reviewed.  Constitutional:      Appearance: She is not ill-appearing or toxic-appearing.  HENT:     Head: Normocephalic and atraumatic.     Right Ear: Hearing and external ear normal.     Left Ear: Hearing and external ear normal.     Nose: Nose normal.     Mouth/Throat:     Lips: Pink.  Eyes:     General: Lids are normal. Vision grossly intact. Gaze aligned appropriately.     Extraocular Movements: Extraocular movements intact.     Conjunctiva/sclera: Conjunctivae normal.  Pulmonary:     Effort: Pulmonary effort is normal.  Musculoskeletal:     Cervical back: Neck supple.       Feet:  Feet:     Comments: Tenderness to palpation of the ventral aspect of the right foot at area indicated above under the second metatarsal.  No erythema, warmth, swelling, or puncture wound appreciated to exam.  Cap refill less than 2 distally.  Normal range of motion.  Ambulatory with steady gait without difficulty.  No lymphangitis. Skin:    General: Skin is warm and dry.     Capillary Refill: Capillary refill takes less than 2 seconds.     Findings: No rash.  Neurological:     General: No focal deficit present.     Mental Status: She is alert and oriented to person, place, and time. Mental status is at baseline.     Cranial Nerves: No dysarthria or facial asymmetry.  Psychiatric:        Mood and Affect: Mood normal.        Speech: Speech normal.        Behavior: Behavior normal.        Thought Content: Thought content normal.        Judgment: Judgment normal.      UC Treatments / Results  Labs (  all labs ordered are listed, but only abnormal results are displayed) Labs Reviewed - No data to display  EKG   Radiology DG Foot Complete Right  Result Date: 08/01/2022 CLINICAL DATA:  Pain.  Possible foreign body. EXAM: RIGHT FOOT COMPLETE - 3+ VIEW COMPARISON:  None  Available. FINDINGS: No fracture or bone lesion. Joints are normally spaced and aligned. Soft tissues are unremarkable.  No radiopaque foreign body. IMPRESSION: Negative. Electronically Signed   By: Amie Portland M.D.   On: 08/01/2022 12:50    Procedures Procedures (including critical care time)  Medications Ordered in UC Medications - No data to display  Initial Impression / Assessment and Plan / UC Course  I have reviewed the triage vital signs and the nursing notes.  Pertinent labs & imaging results that were available during my care of the patient were reviewed by me and considered in my medical decision making (see chart for details).   1. Right foot pain X-ray negative for bony abnormality/foreign body. Patient to use Epsom salt soaks as needed. Ibuprofen as needed for pain. Neurovascularly intact distally to injury/pain. Follow-up with podiatry as needed.  Discussed red flag signs and symptoms of worsening condition,when to call the PCP office, return to urgent care, and when to seek higher level of care in the emergency department. Counseled patient regarding appropriate use of medications and potential side effects for all medications recommended or prescribed today. Patient verbalizes understanding and agreement with plan. Discharged in stable condition.     Final Clinical Impressions(s) / UC Diagnoses   Final diagnoses:  Right foot pain     Discharge Instructions      There are not any foreign bodies in your right foot. X-ray is negative. Please follow-up with podiatry (foot doctor) listed on your paperwork in the next 3-4 days if symptoms continue. Ibuprofen 600mg  every 6 hours as needed for pain and swelling. You may do epsom salt soaks to help with symptoms as well.   Return to urgent care as needed.     ED Prescriptions     Medication Sig Dispense Auth. Provider   ibuprofen (ADVIL) 600 MG tablet Take 1 tablet (600 mg total) by mouth every 6 (six) hours  as needed. 30 tablet Carlisle Beers, FNP      PDMP not reviewed this encounter.   Carlisle Beers, [place] 08/01/22 1258

## 2022-08-27 DIAGNOSIS — Z419 Encounter for procedure for purposes other than remedying health state, unspecified: Secondary | ICD-10-CM | POA: Diagnosis not present

## 2022-11-17 ENCOUNTER — Ambulatory Visit (HOSPITAL_COMMUNITY): Payer: Medicaid Other

## 2023-05-28 ENCOUNTER — Telehealth: Admitting: Family Medicine

## 2023-05-28 DIAGNOSIS — H01001 Unspecified blepharitis right upper eyelid: Secondary | ICD-10-CM

## 2023-05-28 MED ORDER — ERYTHROMYCIN 5 MG/GM OP OINT: 1.0000 | TOPICAL_OINTMENT | Freq: Every day | OPHTHALMIC | 0 refills | Status: AC

## 2023-05-28 NOTE — Patient Instructions (Signed)
Blepharitis Blepharitis refers to inflammation of the eyelids. It is a common condition and can cause dryness or grittiness in the eyes. Other symptoms may include: Reddish, scaly skin around the scalp and eyebrows. Burning or itching of the eyelids. Eye discharge at night that causes the eyelashes to stick together in the morning. Eyelashes that fall out. Redness of the eyes. Sensitivity to light. Follow these instructions at home: Pay attention to any changes in how your eyes look or feel. Tell your health care provider about any changes. Follow these instructions to help with your condition. Keeping clean Wash your hands often with soap and water for at least 20 seconds. Clean your eyes and wash the edges of your eyelids with diluted baby shampoo or commercial eyelid wipes. Do this 2 or more times a day. Wash your face and eyebrows at least once a day. Use a clean towel each time you dry your eyelids. Do not use this towel to clean or dry other areas of your body. Do not share your towel with anyone. General instructions Avoid wearing makeup until you get better. Do not share makeup with anyone. Avoid rubbing your eyes. Use warm compresses on the eyes for 5-10 minutes. Do this 1 or 2 times a day, or as told by your health care provider. You can use warm water on a towel, but a microwaveable heating pad often stays warm longer. The pad should be very warm but not hot enough to burn the skin. If you were prescribed an antibiotic ointment or steroid drops, apply or use the medicine as told by your health care provider. Do not stop using the medicine even if you feel better. Keep all follow-up visits. This is important. Contact a health care provider if: Your eyelids feel hot. You have blisters or a rash on your eyelids. The inflammation gets worse or does not go away in 2-4 days. Get help right away if: You have pain or redness that gets worse or spreads to other parts of your face. Your  vision changes. You have pain when looking at lights or moving objects. You have a fever. Summary Blepharitis refers to inflammation of the eyelids. It can cause dryness and grittiness in the eyes. Pay attention to any changes in how your eyes look or feel. Tell your health care provider about any changes. Follow home care instructions as told by your health care provider. Wash your hands often with soap and water for at least 20 seconds. Avoid wearing makeup. Do not rub your eyes. If you were prescribed an antibiotic ointment or steroid drops, apply or use the medicine as told by your health care provider. Get help right away if you have a fever, vision changes, pain or redness that gets worse or spreads to other parts of your face, or pain when looking at lights or moving objects. This information is not intended to replace advice given to you by your health care provider. Make sure you discuss any questions you have with your health care provider. Document Revised: 02/14/2020 Document Reviewed: 02/14/2020 Elsevier Patient Education  2024 [employer].

## 2023-05-28 NOTE — Progress Notes (Signed)
 Virtual Visit Consent   Michelle Chen, you are scheduled for a virtual visit with a [facility] provider today. Just as with appointments in the office, your consent must be obtained to participate. Your consent will be active for this visit and any virtual visit you may have with one of our providers in the next 365 days. If you have a MyChart account, a copy of this consent can be sent to you electronically.  As this is a virtual visit, video technology does not allow for your provider to perform a traditional examination. This may limit your provider's ability to fully assess your condition. If your provider identifies any concerns that need to be evaluated in person or the need to arrange testing (such as labs, EKG, etc.), we will make arrangements to do so. Although advances in technology are sophisticated, we cannot ensure that it will always work on either your end or our end. If the connection with a video visit is poor, the visit may have to be switched to a telephone visit. With either a video or telephone visit, we are not always able to ensure that we have a secure connection.  By engaging in this virtual visit, you consent to the provision of healthcare and authorize for your insurance to be billed (if applicable) for the services provided during this visit. Depending on your insurance coverage, you may receive a charge related to this service.  I need to obtain your verbal consent now. Are you willing to proceed with your visit today? Michelle Chen has provided verbal consent on 05/28/2023 for a virtual visit (video or telephone). Michelle Huger, FNP  Date: 05/28/2023 1:50 PM   Virtual Visit via Video Note   I, Michelle Chen, connected with  Michelle Chen  ([id number], 01-01-2001) on 05/28/23 at  1:45 PM EDT by a video-enabled telemedicine application and verified that I am speaking with the correct person using two identifiers.  Location: Patient: Virtual Visit Location Patient:  Home Provider: Virtual Visit Location Provider: Home Office   I discussed the limitations of evaluation and management by telemedicine and the availability of in person appointments. The patient expressed understanding and agreed to proceed.    History of Present Illness: Michelle Chen is a 23 y.o. who identifies as a female who was assigned female at birth, and is being seen today for swelling in rt upper eyelid. Reoccurring every few months. No makeup and it does not happen when she wears eyelashes.   HPI: HPI  Problems:  Patient Active Problem List   Diagnosis Date Noted   S/P cesarean section 07/18/2021   History of gestational diabetes 05/08/2021    Allergies: No Known Allergies Medications:  Current Outpatient Medications:    cephALEXin  (KEFLEX ) 500 MG capsule, Take 1 capsule (500 mg total) by mouth 2 (two) times daily., Disp: 10 capsule, Rfl: 0   etonogestrel  (NEXPLANON ) 68 MG IMPL implant, 1 each (68 mg total) by Subdermal route once for 1 dose., Disp: 1 each, Rfl: 0   fluconazole  (DIFLUCAN ) 150 MG tablet, Take 1 tablet (150 mg total) by mouth daily. Take one now and repeat in 1 week if needed., Disp: 2 tablet, Rfl: 0   ibuprofen  (ADVIL ) 600 MG tablet, Take 1 tablet (600 mg total) by mouth every 6 (six) hours as needed., Disp: 30 tablet, Rfl: 0   Prenatal 27-1 MG TABS, Take 1 tablet by mouth daily., Disp: 30 tablet, Rfl: 0  Observations/Objective: Patient is well-developed, well-nourished in no acute distress.  Resting  comfortably  at home.  Head is normocephalic, atraumatic.  No labored breathing.  Speech is clear and coherent with logical content.  Patient is alert and oriented at baseline.    Assessment and Plan: 1. Blepharitis of right upper eyelid, unspecified type (Primary)  Follow up with ophthalmology.  Follow Up Instructions: I discussed the assessment and treatment plan with the patient. The patient was provided an opportunity to ask questions and all were  answered. The patient agreed with the plan and demonstrated an understanding of the instructions.  A copy of instructions were sent to the patient via MyChart unless otherwise noted below.     The patient was advised to call back or seek an in-person evaluation if the symptoms worsen or if the condition fails to improve as anticipated.    Kimothy Kishimoto, FNP

## 2023-06-15 IMAGING — US US MFM OB FOLLOW-UP
1 series · 12 of 28 positions shown · non-contrast
Comparison: none

[Series 1: us mfm ob follow-up · 12 of 37 slices shown]
[im 2/37]
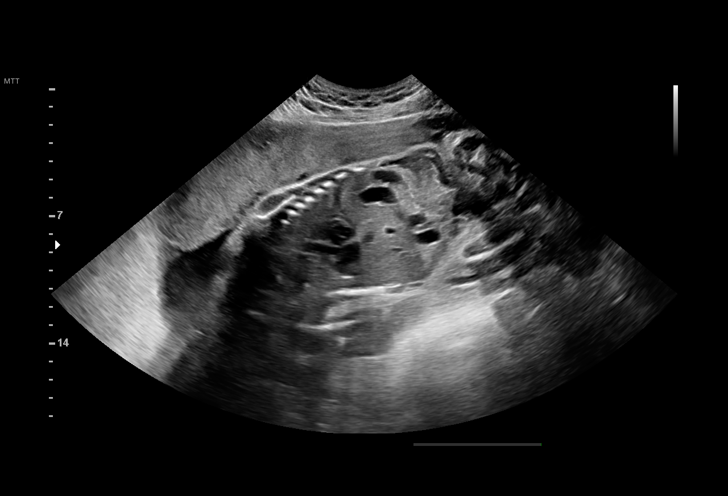
[im 5/37]
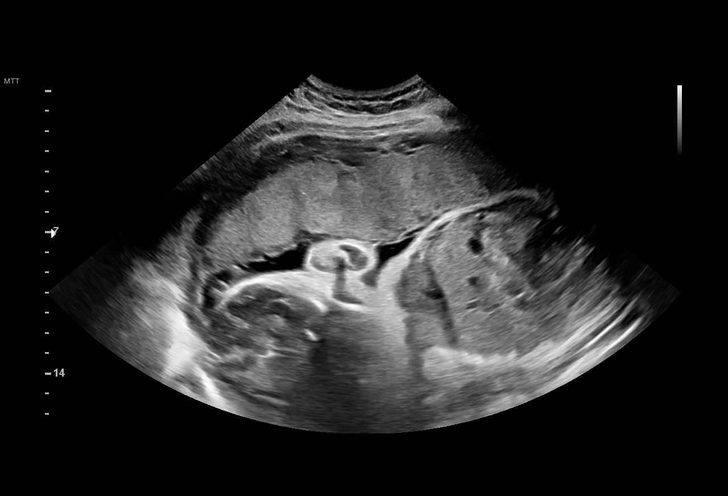
[im 7/37]
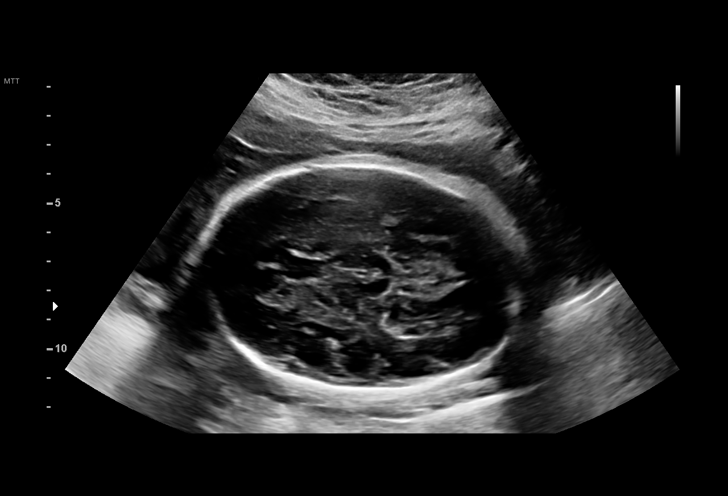
[im 11/37]
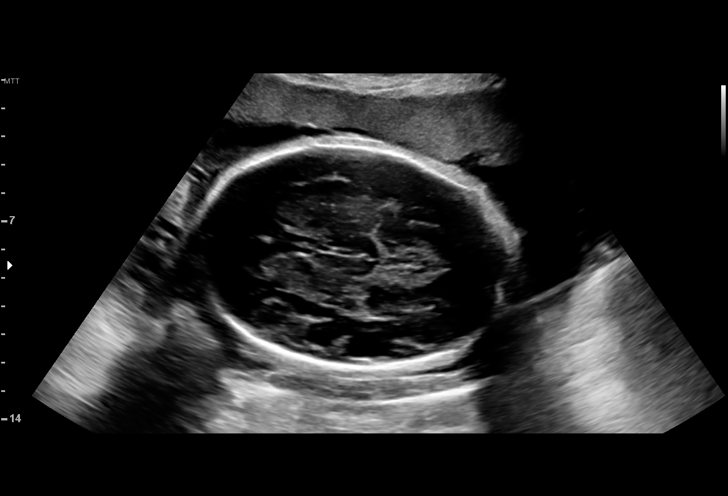
[im 14/37]
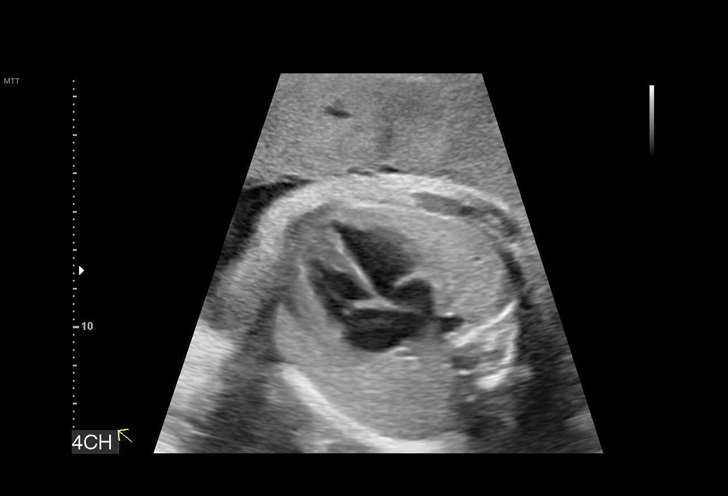
[im 17/37]
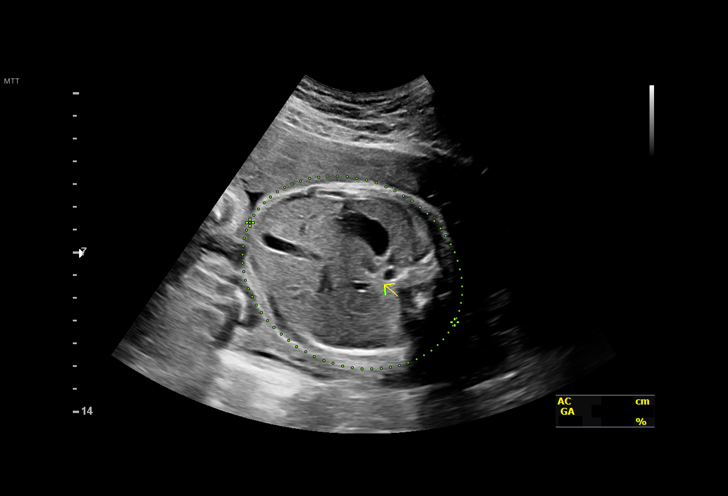
[im 21/37]
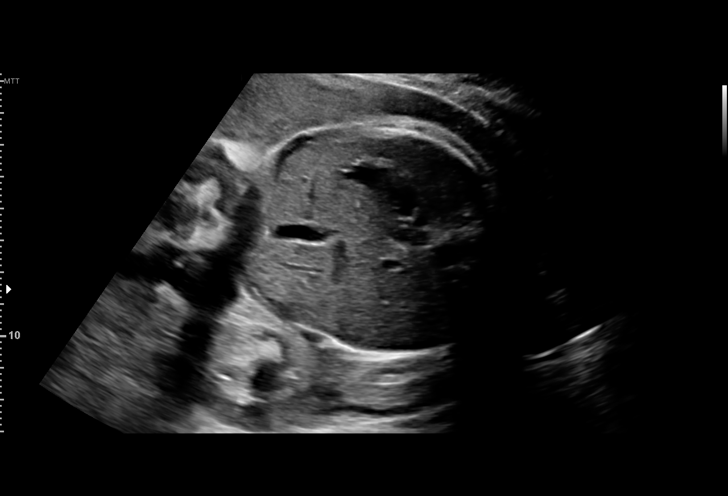
[im 23/37]
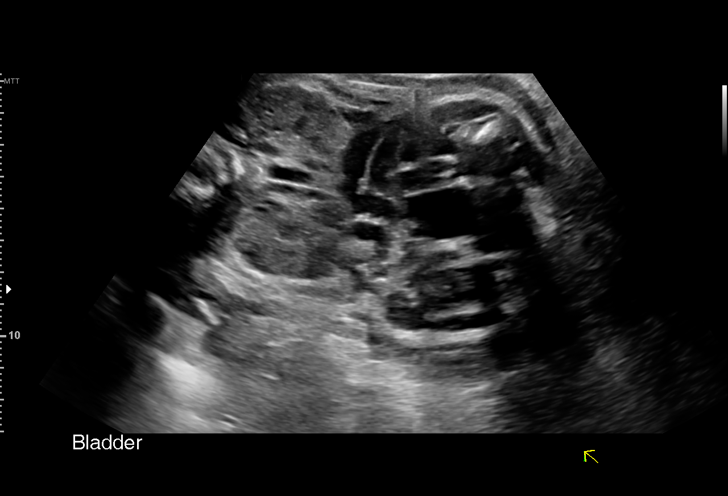
[im 26/37]
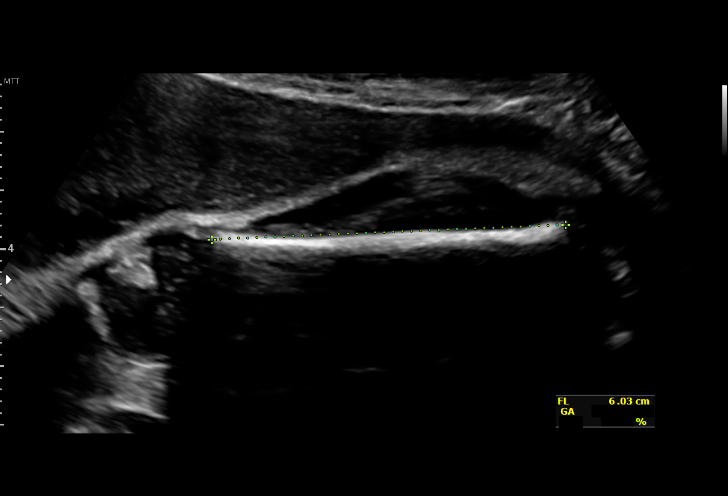
[im 30/37]
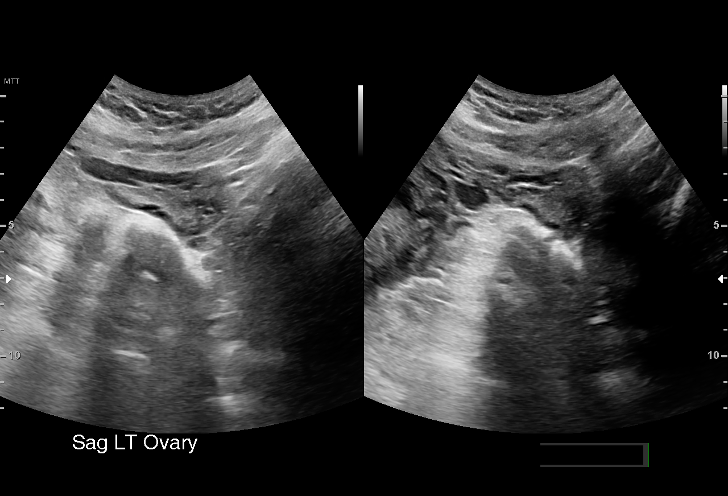
[im 33/37]
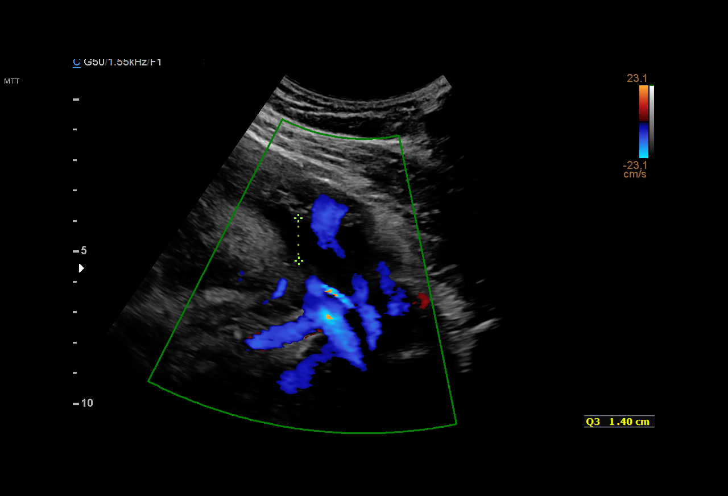
[im 35/37]
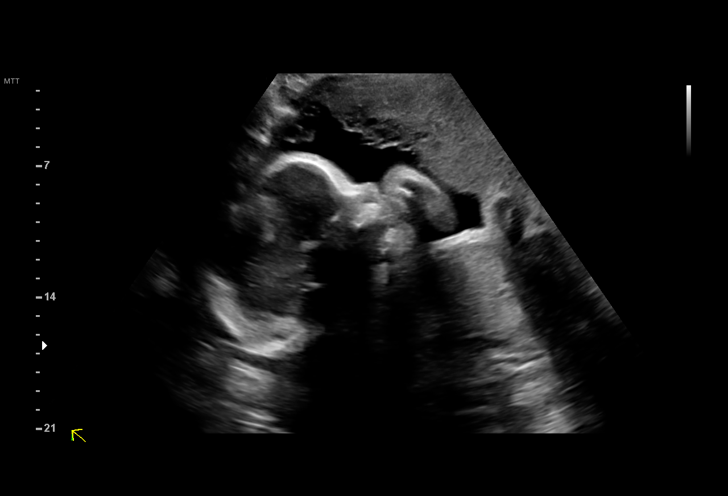

[12 of 28 positions shown; findings below may reference images not displayed]

Indications

 Gestational diabetes in pregnancy, diet
 controlled
 Obesity complicating pregnancy, third
 trimester (BMI 30)
 30 weeks gestation of pregnancy
 Encounter for other antenatal screening
 follow-up
 LR NIPS/ Negative AFP
Fetal Evaluation

 Num Of Fetuses:         1
 Fetal Heart Rate(bpm):  136
 Cardiac Activity:       Observed
 Presentation:           Breech
 Placenta:               Anterior
 P. Cord Insertion:      Previously Visualized

 Amniotic Fluid
 AFI FV:      Within normal limits

 AFI Sum(cm)     %Tile       Largest Pocket(cm)
 12.8            37

 RUQ(cm)       RLQ(cm)       LUQ(cm)        LLQ(cm)

Biometry
 BPD:      77.1  mm     G. Age:  31w 0d         50  %    CI:        65.73   %    70 - 86
                                                         FL/HC:      19.9   %    19.3 -
 HC:      305.2  mm     G. Age:  34w 0d         94  %    HC/AC:      1.09        0.96 -
 AC:      281.2  mm     G. Age:  32w 1d         87  %    FL/BPD:     78.6   %    71 - 87
 FL:       60.6  mm     G. Age:  31w 4d         62  %    FL/AC:      21.6   %    20 - 24

 LV:        4.4  mm

 Est. FW:    4773  gm      4 lb 3 oz     85  %
OB History

 Blood Type:   B+
 Gravidity:    1
Gestational Age

 LMP:           30w 4d        Date:  10/18/20                  EDD:   07/25/21
 U/S Today:     32w 1d                                        EDD:   07/14/21
 Best:          30w 4d     Det. By:  LMP  (10/18/20)          EDD:   07/25/21
Anatomy

 Cranium:               Appears normal         LVOT:                   Previously seen
 Cavum:                 Appears normal         Aortic Arch:            Previously seen
 Ventricles:            Appears normal         Ductal Arch:            Previously seen
 Choroid Plexus:        Previously seen        Diaphragm:              Appears normal
 Cerebellum:            Previously seen        Stomach:                Appears normal, left
                                                                       sided
 Posterior Fossa:       Previously seen        Abdomen:                Previously seen
 Nuchal Fold:           Previously seen        Abdominal Wall:         Previously seen
 Face:                  Orbits and profile     Cord Vessels:           Previously seen
                        previously seen
 Lips:                  Previously seen        Kidneys:                Appear normal
 Palate:                Not well visualized    Bladder:                Appears normal
 Thoracic:              Appears normal         Spine:                  Previously seen
 Heart:                 Appears normal         Upper Extremities:      Previously seen
                        (4CH, axis, and
                        situs)
 RVOT:                  Previously seen        Lower Extremities:      Previously seen

 Other:  Male gender previously seen. Heels/feet and Left open hand/ Left 5th
         digit visualized previously. 3VV, 3VTV, Right  open hand/5th digit
         visualized. Technically difficult due to fetal position.
Cervix Uterus Adnexa

 Cervix
 Length:              4  cm.
 Normal appearance by transabdominal scan.

 Uterus
 No abnormality visualized.

 Right Ovary
 Within normal limits.
 Left Ovary
 Within normal limits.

 Cul De Sac
 No free fluid seen.

 Adnexa
 No abnormality visualized.
Impression

 Fetal growth is appropriate for gestational age.  Amniotic fluid
 is normal and good fetal activity seen.  Breech presentation.
 xxxxxxxxxxxxxxxxxxxxxxxxx[id number]
 Consultation (see [REDACTED] )

 [facility] the [facility] She is G1 P0 at 30w 4d gestation
 and has a new diagnosis of gestational diabetes.
 Patient had consultation with diabetic educator today.  She
 reports she has been checking her blood glucose for the last
 1 week and both her fasting and postprandial levels are
 within normal range.
 Her blood pressure today at her office is 109/62 mmHg.

 Gestational diabetes
 I explained the diagnosis of gestational diabetes.  I
 emphasized the importance of good blood glucose control to
 prevent adverse fetal or neonatal outcomes.  I discussed
 blood glucose normal values. I encouraged her to check her
 blood glucose regularly.
 Possible complications of gestational diabetes include fetal
 macrosomia, shoulder dystocia and birth injuries, stillbirth (in
 poorly controlled diabetes) and neonatal respiratory
 syndrome and other complications.

 In about 85% of cases, gestational diabetes is well controlled
 by diet alone.  Exercise reduces the need for insulin.  Medical
 treatment includes oral hypoglycemics or insulin.

 Timing of delivery: In well-controlled diabetes on diet, patient
 can be delivered at 39- or 40-weeks' gestation. Vaginal
 delivery is not contraindicated.
 Type 2 diabetes develops in up to 50% of women with GDM. I
 recommend postpartum screening with 75-g glucose load at
 6 to 12 weeks after delivery.
Recommendations

 -An appointment was made for her to return in 4 weeks for
 fetal growth assessment.
 -If patient requires metformin or insulin, we recommend
 weekly BPP from 32 weeks gestation till delivery.
                [place], Omayra

## 2023-06-18 ENCOUNTER — Ambulatory Visit (HOSPITAL_COMMUNITY): Admission: EM | Admit: 2023-06-18 | Discharge: 2023-06-18 | Disposition: A | Discharge status: Home / Self Care

## 2023-06-18 ENCOUNTER — Encounter (HOSPITAL_COMMUNITY): Payer: Self-pay

## 2023-06-18 DIAGNOSIS — M545 Low back pain, unspecified: Secondary | ICD-10-CM | POA: Diagnosis not present

## 2023-06-18 MED ORDER — NAPROXEN 500 MG PO TABS: 500.0000 mg | ORAL_TABLET | Freq: Two times a day (BID) | ORAL | 0 refills | Status: AC

## 2023-06-18 MED ORDER — DEXAMETHASONE SODIUM PHOSPHATE 10 MG/ML IJ SOLN
10.0000 mg | Freq: Once | INTRAMUSCULAR | Status: AC
  Administered 2023-06-18: 10 mg via INTRAMUSCULAR

## 2023-06-18 MED ORDER — LIDOCAINE 5 % EX PTCH: 1.0000 | MEDICATED_PATCH | CUTANEOUS | 0 refills | Status: AC

## 2023-06-18 MED ORDER — DEXAMETHASONE SODIUM PHOSPHATE 10 MG/ML IJ SOLN
INTRAMUSCULAR | Status: AC
  Filled 2023-06-18: qty 1

## 2023-06-18 NOTE — ED Provider Notes (Signed)
 [facility]    CSN: [id number] Arrival date & time: 06/18/23  1347      History   Chief Complaint Chief Complaint  Patient presents with   Back Pain    HPI Michelle Chen is a 23 y.o. female.   Patient presents with right low back pain x 4 days.  Patient states that initially she had pain on both sides, but then began to have pain only to the right side.  Patient denies any recent falls or known injury.  Patient states the pain is worse when sitting down or lying flat on her back.  Patient describes the pain as achy.  Denies pain, numbness, tingling, or weakness radiating down her legs.  Denies dysuria, urinary frequency/urgency, and hematuria.  Patient states that she has been taking Tylenol  with relief, but then the pain will return.  The history is provided by the patient and medical records.  Back Pain   Past Medical History:  Diagnosis Date   Gestational diabetes    Medical history non-contributory    Vaginal Pap smear, abnormal     Patient Active Problem List   Diagnosis Date Noted   S/P cesarean section 07/18/2021   History of gestational diabetes 05/08/2021    Past Surgical History:  Procedure Laterality Date   CESAREAN SECTION N/A 07/18/2021   Procedure: CESAREAN SECTION;  Surgeon: Teena Feast, MD;  Location: [facility];  Service: Obstetrics;  Laterality: N/A;   MOUTH SURGERY     NEXPLANON  TRAY  07/20/2021    OB History     Gravida  1   Para  1   Term  1   Preterm  0   AB  0   Living  1      SAB  0   IAB  0   Ectopic  0   Multiple  0   Live Births  1            Home Medications    Prior to Admission medications   Medication Sig Start Date End Date Taking? Authorizing Provider  lidocaine  (LIDODERM ) 5 % Place 1 patch onto the skin daily. Remove & Discard patch within 12 hours or as directed by MD 06/18/23  Yes Levora Reas A, NP  naproxen (NAPROSYN) 500 MG tablet Take 1 tablet (500 mg total) by mouth 2  (two) times daily. 06/18/23  Yes Levora Reas A, NP  neomycin -polymyxin b-dexamethasone  (MAXITROL) 3.5-10000-0.1 OINT  06/01/23  Yes [provider]  etonogestrel  (NEXPLANON ) 68 MG IMPL implant 1 each (68 mg total) by Subdermal route once for 1 dose. 07/20/21 06/18/23 Yes Lacey Pian, MD  omeprazole  (PRILOSEC) 20 MG capsule Take 1 capsule (20 mg total) by mouth daily. 05/09/18 10/30/19  Landa Pine, FNP    Family History Family History  Problem Relation Age of Onset   Learning disabilities Sister    Asthma Sister    Asthma Brother    Asthma Maternal Grandmother    Alzheimer's disease Maternal Grandfather     Social History Social History   Tobacco Use   Smoking status: Never    Passive exposure: Past   Smokeless tobacco: Never   Tobacco comments:    gma smokes in and out of the home   Vaping Use   Vaping status: Never Used  Substance Use Topics   Alcohol use: Not Currently    Comment: occasionally   Drug use: Not Currently     Allergies   Patient has  no known allergies.   Review of Systems Review of Systems  Musculoskeletal:  Positive for back pain.   Per HPI  Physical Exam Triage Vital Signs ED Triage Vitals  Encounter Vitals Group     BP 06/18/23 1501 106/73     Systolic BP Percentile --      Diastolic BP Percentile --      Pulse Rate 06/18/23 1501 77     Resp 06/18/23 1501 16     Temp 06/18/23 1501 98.4 F (36.9 C)     Temp Source 06/18/23 1501 Oral     SpO2 06/18/23 1501 99 %     Weight --      Height --      Head Circumference --      Peak Flow --      Pain Score 06/18/23 1459 10     Pain Loc --      Pain Education --      Exclude from Growth Chart --    No data found.  Updated Vital Signs BP 106/73 (BP Location: Right Arm)   Pulse 77   Temp 98.4 F (36.9 C) (Oral)   Resp 16   LMP 05/28/2023 (Approximate)   SpO2 99%   Breastfeeding No   Visual Acuity Right Eye Distance:   Left Eye Distance:   Bilateral Distance:     Right Eye Near:   Left Eye Near:    Bilateral Near:     Physical Exam Vitals and nursing note reviewed.  Constitutional:      General: She is awake. She is not in acute distress.    Appearance: Normal appearance. She is well-developed and well-groomed. She is not ill-appearing.  Musculoskeletal:     Cervical back: Normal.     Thoracic back: Normal.     Lumbar back: No swelling, deformity, tenderness or bony tenderness. Normal range of motion. Negative right straight leg raise test and negative left straight leg raise test.  Skin:    General: Skin is warm and dry.  Neurological:     Mental Status: She is alert.  Psychiatric:        Behavior: Behavior is cooperative.      UC Treatments / Results  Labs (all labs ordered are listed, but only abnormal results are displayed) Labs Reviewed - No data to display  EKG   Radiology No results found.  Procedures Procedures (including critical care time)  Medications Ordered in UC Medications  dexamethasone  (DECADRON ) injection 10 mg (10 mg Intramuscular Given 06/18/23 1536)    Initial Impression / Assessment and Plan / UC Course  I have reviewed the triage vital signs and the nursing notes.  Pertinent labs & imaging results that were available during my care of the patient were reviewed by me and considered in my medical decision making (see chart for details).     Patient is well-appearing.  Vitals are stable.  Upon assessment patient is nontender upon palpation to the low back.  There is no obvious deformity, swelling, or decreased range of motion noted on exam.  Negative bilateral straight leg raise test.  Patient endorses is increased pain when lying down and upon sitting.  Given IM Decadron  in clinic for inflammation.  Prescribed naproxen as needed for pain.  Prescribed lidocaine  patches as needed for pain.  Recommended continuing Tylenol  as needed.  Given orthopedic follow-up.  Discussed return precautions. Final  Clinical Impressions(s) / UC Diagnoses   Final diagnoses:  Acute right-sided low back pain  without sciatica     Discharge Instructions      You are given a steroid injection in clinic today to help with inflammation causing your pain. Take naproxen twice daily as needed for pain.  Do not take this with other NSAIDs including ibuprofen , Motrin , Advil , Aleve, naproxen, Goody powder. You can continue to take Tylenol  every 6 hours as needed for breakthrough pain. You can also apply lidocaine  patch for 12 hours at a time once daily to your back to help with pain. Alternate between ice and heat as needed for pain. Follow-up with [facility] if pain continues. Follow-up with primary care provider or return here as needed.  ED Prescriptions     Medication Sig Dispense Auth. Provider   naproxen (NAPROSYN) 500 MG tablet Take 1 tablet (500 mg total) by mouth 2 (two) times daily. 30 tablet Rosevelt Constable, Jadrien Narine A, NP   lidocaine  (LIDODERM ) 5 % Place 1 patch onto the skin daily. Remove & Discard patch within 12 hours or as directed by MD 30 patch Karon Packer, NP      PDMP not reviewed this encounter.   Levora Reas A, NP 06/18/23 1539

## 2023-06-18 NOTE — ED Triage Notes (Signed)
 Patient presenting with right lower back pain onset 4 days ago. No known injuries or falls. Patient states it is like an aching pain that is constant. Pain worse with laying flat on the back.  Prescriptions or OTC medications tried: Yes- tylenol     with mild relief

## 2023-06-18 NOTE — Discharge Instructions (Addendum)
 You are given a steroid injection in clinic today to help with inflammation causing your pain. Take naproxen twice daily as needed for pain.  Do not take this with other NSAIDs including ibuprofen , Motrin , Advil , Aleve, naproxen, Goody powder. You can continue to take Tylenol  every 6 hours as needed for breakthrough pain. You can also apply lidocaine  patch for 12 hours at a time once daily to your back to help with pain. Alternate between ice and heat as needed for pain. Follow-up with [facility] if pain continues. Follow-up with primary care provider or return here as needed.
